# Patient Record
Sex: Female | Born: 1995 | Race: Black or African American | Hispanic: No | Marital: Single | Smoking: Never smoker
Health system: Southern US, Community
[De-identification: ages and names within clinical notes are randomized; demographics above are authoritative.]

## PROBLEM LIST (undated history)

## (undated) DIAGNOSIS — M419 Scoliosis, unspecified: Secondary | ICD-10-CM

## (undated) DIAGNOSIS — R87629 Unspecified abnormal cytological findings in specimens from vagina: Secondary | ICD-10-CM

## (undated) DIAGNOSIS — F32A Depression, unspecified: Secondary | ICD-10-CM

## (undated) DIAGNOSIS — G4700 Insomnia, unspecified: Secondary | ICD-10-CM

## (undated) HISTORY — DX: Scoliosis, unspecified: M41.9

## (undated) HISTORY — DX: Unspecified abnormal cytological findings in specimens from vagina: R87.629

## (undated) HISTORY — DX: Insomnia, unspecified: G47.00

## (undated) HISTORY — PX: COLPOSCOPY: SHX161

## (undated) HISTORY — DX: Depression, unspecified: F32.A

---
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
## 2013-06-25 ENCOUNTER — Ambulatory Visit: Payer: Self-pay | Admitting: Pediatrics

## 2013-07-02 ENCOUNTER — Ambulatory Visit: Payer: Self-pay | Admitting: Pediatrics

## 2013-07-02 VITALS — BP 111/76 | HR 72 | Ht 67.32 in | Wt 126.3 lb

## 2013-07-02 DIAGNOSIS — Z00129 Encounter for routine child health examination without abnormal findings: Secondary | ICD-10-CM

## 2013-07-02 MED ORDER — NORELGESTROMIN-ETH ESTRADIOL 150-35 MCG/24HR TD PTWK *I*
1.0000 | MEDICATED_PATCH | TRANSDERMAL | Status: DC
Start: 2013-07-02 — End: 2013-12-24

## 2013-07-02 NOTE — Patient Instructions (Signed)
May start with Patch this Sunday -- i patch weekly for three weeks and then 1 patch free week  Continue to use condoms until on patch for 1 month    Go to www.healthychildren.org for more information    Your Growing and Changing Body   Brush your teeth twice a day; Visit the dentist twice a year.   Eat 3 healthy meals a day; Eating breakfast is very important.   Consider choosing water instead of soda.   Limit high-fat foods and drinks such as candy, chips, and soft drinks.   5 fruits and vegetables a day; 3 cups of low-fat milk, yogurt, or cheese.   Eat with your family often.   Aim for 1 hour of moderately vigorous physical activity every day.   Try to limit watching TV, playing video games, or playing on the computer to 2 hours a day    Be proud of yourself when you do something good.    Healthy Behavior Choices   Find fun, safe things to do.   Talk to your parents about alcohol and drug use.   Support friends who choose not to use tobacco, alcohol, drugs, steroids, or diet pills.   Talk about relationships, sex, and values with your parent/guardian   Talk about puberty and sexual pressures with someone you trust   Follow your family's rules.    Violence and Injuries   Always wear your seatbelt; Do not ride ATVs.   Wear protective gear including helmets for playing sports, biking, skating, and skateboarding.   Make sure you know how to get help if you are feeling unsafe.   Never have a gun in the home. If necessary, store it unloaded and locked with the ammunition locked separately from the gun.   Figure out nonviolent ways to handle anger or fear. Fighting and carrying weapons can be dangerous. You can talk to me about how to avoid these situations.   Healthy dating relationships are built on respect, concern, and doing things both of you like to do.    How You Are Feeling   Figure out healthy ways to deal with stress; Spend time with your family.   Always talk through problems and never  use violence; Look for ways to help out at home.   It's important for you to have accurate information about sexuality, your physical development, and your sexual feelings.   Please consider asking me if you have   any questions.    School and Friends   Try your best to be responsible for your schoolwork.   If you need help organizing your time, ask your parents or teachers.   Read often.   Find activities you are really interested in, such as sports or theater.   Find activities that help others.   Spend time with your family and help at home.   Stay connected with your parents

## 2013-07-02 NOTE — Progress Notes (Signed)
Adolescent Health Supervision Visit     Subjective   History was provided by (name/relationship): patient.    Sheena Pitts is a 18 y.o. female who was brought in for this adolescent well visit.  Current concerns include: knot in right hip area for about 5 days.    ROS:   Constitutional: negative  Had strep beginning of December -- better with medication  Hurt back beginning of October  - chiropractic helped  Reviewed GAPS form: Yes. Areas of concern: none.    Nutrition/Activity:   Daily fruit/vegetable consumption : 4  Hours in front of screen/day : 2  Daily amount participation in physical activity : > 1  Consumption of sugar added beverages (e.g. Soda, fruit/sports drinks) : occasionally    Screening:  Has a dental home? 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 No  --Able to manage anger/has support system: Yes  --Who do you talk to when you're upset? Mom, friend  (ZH:YQMVHQIONAG:resources, having trusted adult to talk to when upset)    Tobacco:  --Use tobacco? No  --Others smoke in the home?No  GE:XBMWAG:None    ETOH/Drugs:  --Drinks alcohol/how much?: Yes at college - is very careful  --Use marijuana/other drugs?  No    UX:LKGMAG:Peer pressure  Gateway  effects    Sexuality:  Female Reproductive System: Menarche  Age: [age]  Regular menstrual cycle:  Yes  Cramps, PMS:  no  Sexuality: Sexual orientation:  heterosexual  Is sexually active with 1 partner,using condoms  HIV testing offered: testing offered and declined  Need for emergency contraception?: No (if unprotected intercourse in past 5 days)  AG(discussed) :Contraceptive options (start with LARC)  Will use patch    Chaperone offered for PE exam : Refused    Objective   Physical Exam  BP 111/76   Pulse 72   Ht 1.71 m (5' 7.32")   Wt 57.3 kg (126 lb 5.2 oz)   BMI 19.6 kg/m2  Weight %ile: 48%ile (Z=-0.05) based on CDC 2-20 Years weight-for-age data.   Height%tile: 88%ile (Z=1.19) based on CDC 2-20 Years stature-for-age data.  BMI %ile: 23%ile (Z=-0.74) based on CDC 2-20 Years BMI-for-age data.  BP %ile: 46.1% systolic and 83.5% diastolic of BP percentile by age, sex, and height.     GEN:    Alert, well-appearing, in NAD  HEAD:   Normocephalic, atraumatic  EYES:    PERRL, EOMI, conjunctiva clear  EARS:  TMs with normal landmarks, external canal normal  NOSE:   Nares normal  MOUTH: OP clear, MMM, normal dentition  NECK:  FROM, no lymphadenopathy  LUNGS:   CTAB, no wheeze or rales   CV:    RRR, normal S1/S2, no murmur  ABD:    Soft, nondistended,  normal BS, no HSM  MUSC:  FROM all extremities, no joint tenderness  TANNER: Female - breasts 5 and Female - genitalia 5  SPINE:  Mild scoliosis with right rib hump on bending forward  NEURO: Normal tone and strength throughout, gait normal  SKIN:    No rash    Assessment     Healthy 18 y.o. female child, here for [facility].  Active and chronic issues include: mild scoliosis    Plan     1. Current issues: ortho evra patch -- instructions provided on use  Continue with condom use for a month after start patch     2. Anticipatory guidance discussed:drugs, ETOH, and tobacco, importance of regular dental care, importance of exercise and varied diet, sex; STD and pregnancy prevention,  condom use and emotions/coping    3.  Weight management, Self Care and Community Resources   Weight status is healthy weight (5 - <85%tile); Counseled about nutrition and physical activity   Per AAP policy guidelines, prior goal was: 5 fruits and vegetables every day, less than 2 hours of TV/screen time per day, 1 hour exercise per day and 0 sugary drinks.    Patient is currently: Partially successful, will keep working at it   Identify goals for patient to work on (give passport to health): 5 fruits and vegetables every day, less than 2 hours of TV/screen time per day, 1 hour exercise per day and 0 sugary drinks   Motivational Interviewing: contemplative   Self documentation tool given: Handout (food diary, asthma calendar)   Self management tools: www.HealthyChildren.org and www.beahealthyhero.org (5-2-1-0)    4. Immunizations today: Immunizations given:Hepatitis A and Influenza (injectable); I have educated the patient's parent/guardian/designee about each component in all the immunizations and toxoids that are being given today, have reviewed potential side effects and have answered the patients parent/guardian/designee questions about the vaccinations.     5. Labs: Urine for amplification    6. Follow-up visit in 1 year for next annual well visit, or sooner as needed.    7. For confidential results: Provider to call preferred #: [phone]    8. Transitioning to adult care : n/a     Deatra [place], NP 07/02/2013 2:30 PM

## 2013-07-03 LAB — CHLAMYDIA PLASMID DNA AMPLIFICATION: Chlamydia Plasmid DNA Amplification: 0

## 2013-07-03 LAB — N. GONORRHOEAE DNA AMPLIFICATION: N. gonorrhoeae DNA Amplification: 0

## 2013-12-17 ENCOUNTER — Encounter: Payer: Self-pay | Admitting: Pediatric Cardiology

## 2013-12-17 ENCOUNTER — Ambulatory Visit: Payer: Self-pay | Admitting: Pediatric Cardiology

## 2013-12-17 ENCOUNTER — Encounter: Payer: Self-pay | Admitting: Gastroenterology

## 2013-12-17 ENCOUNTER — Ambulatory Visit: Payer: Self-pay | Admitting: Pediatric Genetics

## 2013-12-17 ENCOUNTER — Encounter: Payer: Self-pay | Admitting: Pediatric Genetics

## 2013-12-17 VITALS — BP 106/60 | HR 76 | Resp 20 | Ht 67.32 in | Wt 129.0 lb

## 2013-12-17 DIAGNOSIS — R29898 Other symptoms and signs involving the musculoskeletal system: Secondary | ICD-10-CM

## 2013-12-17 DIAGNOSIS — Z136 Encounter for screening for cardiovascular disorders: Secondary | ICD-10-CM

## 2013-12-17 DIAGNOSIS — M419 Scoliosis, unspecified: Secondary | ICD-10-CM | POA: Insufficient documentation

## 2013-12-17 NOTE — Progress Notes (Signed)
Chief Complaint:    Sheena Pitts is a 18 y.o. female being seen for evaluation of possible Marfan syndrome on 12/17/2013. She was last seen in cardiogenetics clinic in 2013. She was accompanied to today's visit by herself.    HPI:  Sheena Pitts is a 18 y.o. female with right thoracolumbar scoliosis, joint hyperextensibility, and tall stature. She was last seen in cardiogenetics clinic in 2013. At this time, Sheena Pitts did not meet sufficient criteria for Marfan syndrome, but a follow-up appointment was recommended to reassess her.    Scoliosis: moderate  Pectus excavatum: none  Pectus carinatum: none  Joint hypermobility: none  Lens Dislocation: none  Myopia: moderate  Marfanoid habitus: mild   Cardiac: none  Others: joint hyperextensibility    Family history:     Sudden cardiac death: no  Aortic aneurysm: no  MVP: no  Lens dislocation: no  Marfan syndrome: no  Genetic testing: no    Problem List:  Patient Active Problem List   Diagnosis Code    Thoracolumbar Spine Scoliosis     Back pain 724.5    Scoliosis 737.30    Joint hyperextensibility 718.80       Medications:  Current outpatient prescriptions:norelgestromin-ethinyl estradiol (ORTHO EVRA) 150-35 MCG/24HR patch, Place 1 patch onto the skin once a week   Use for 3 weeks, followed by 1 patch-free week., Disp: 3 patch, Rfl: 11,     Allergies:  No Known Allergies (drug, envir, food or latex)    Previous Genetic Test(s):  none    ROS:  General - negative  Head - negative  Neck - negative   Eye - 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 Currently attends [employer]. She wants to be a  Clinical research associatelawyer.    Social History:  Sheena Pitts lives with her mother and father. Mother works in a nursing facility. Father works in [employer] school [employer].    Family History: See scanned pedigree (Media tab) for full details.  Pertinent findings include:    Family History   Problem Relation Age of Onset    Clotting disorder Mother 2841     pulmonary embolism 2x  on coumadin    Hypertension Mother     Hypertension Maternal Uncle     Aneurysm Maternal Grandmother      brain aneurysm; passed away at 1249    Hypertension Maternal Grandmother     Chdhd hrt surg Neg Hx     Heart defect Neg Hx     Marfan syndrome Neg Hx     SIDS Neg Hx     Sudden death Neg Hx     Other Other      niece; died at 66 months of age- Sheena Pitts was not certain of cause of death    Cancer Maternal Uncle [id number]     Colon cancer. Passed away at 48.       Physical Exam:  There were no vitals filed for this visit.    Systemic Score (?7 considered positive):    Wrist AND thumb sign 0   Wrist OR  thumb sign 1   Pectus carinatum deformity 0   Pectus excavatum or chest asymmetry 0   Hindfoot deformity 0   Plain flat foot (pes planus) 0   Pneumothorax 0   Reduced upper segment / lower segment AND increased arm span/height ratios 0  Scoliosis or thoracolumbar kyphosis 1   Reduced elbow extension 0  3 of 5 facial features 0   Skin striae 0  Myopia 1   Mitral valve prolapse 0    Orders placed during this visit:  No orders of the defined types were placed in this encounter.       Impression and Recommendations:  Sheena Pitts is a 18 y.o. female with a history of Marfanoid habitus but who is now no longer showing such strong impressions.  For this reason I do not think that any further follow-up for this reason will be needed.    Counseling/Education/Follow-Up:    Counseling regarding - diagnosis/differential diagnosis and recommended tests    Follow - Up: none.    Sheena Pitts did not receive a clinical diagnosis of Marfan syndrome at today's appointment. Her echocardiogram was  normal. No genetic testing was recommended or performed at today's visit. No follow-up needed.      [facility] you [facility].  Please contact us at [phone] with any questions or concerns.    I spent 45 minutes with Sheena Pitts and her family, >50% of which was spent in counseling.    Felicity Pellegrinihin-To Xian Apostol, MD  Attending, Division of Genetics

## 2013-12-17 NOTE — Progress Notes (Signed)
 I had the opportunity to evaluate Sheena Pitts in follow up in the cardiogenetics clinic at the [facility] at [facility] on 12/17/2013.  As you recall, she is the 18 y.o. 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 She had no cardiac criteria, but Dr. Blake Divine recommended follow up in 2-3 years after she had completed more growth.  She comes to clinic with her self.       Sheena Pitts was originally referred after she was evaluated for her scoliosis by orthopedics who noted arachnodactyly.  Her scoliosis is mild and has not required intervention.  She has no known family history of Marfan's or aortic surgery for valve replacement, aortic dissections.  There is no history of early unexplained sudden cardiac death.  Her father is quite tall at 72'4" but is alive and well at age 75.  The family history on his side of the family is not as well known to [name] and her mother, but mom does not recall anything significant in terms of the heart.  Sheena Pitts's mother has a history of pulmonary embolism twice for which she has had a full hematologic workup, but nothing has come back positive for procoagulant states though she is now on coumadin.  There is a history of sudden death from a brain aneurysm in her maternal grandmother who passed away at age 57. There is no new history of Marfan's or connective tissue disorder. There is no interval history of aortic surgery/dissection.    Sheena Pitts is asymptomatic from a cardiac standpoint.  Sheena Pitts denies cardiovascular symptoms such as chest pain, palpitations, tachycardia, dizziness, syncope, shortness of breath, fatigue or exercise intolerance.  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 She is working at [employer] this summer and living at home.    Sheena Pitts is otherwise healthy and has had no major illnesses, hospitalizations or surgeries. She did go to the  chiropractor for back pain who "rotated her tail bone" which relieved her discomfort. She had a dilated eye exam with a new prescription last year. Her growth and development have been normal.  Immunizations are up to date by family report. Her     Patient's medications, allergies, past medical, surgical, social and family histories were reviewed and updated as appropriate.  She is on no current medications.    Review of Systems:   Eyes:        Wears glasses for nearsightedness.   HENT: Positive for cavities.    Hematological: Positive for anemia. Bruises/bleeds easily.   Musculoskeletal:        Back pain   Neurological: Negative for syncope.   Allergic/Immunologic: Negative for medication allergies.   All other systems reviewed and are negative.      Physical Exam:   Blood pressure 106/60, pulse 76, resp. rate 20, height 1.71 m (5' 7.32"), weight 58.5 kg (128 lb 15.5 oz), SpO2 100 %., Body mass index is 20.01 kg/(m^2).       12/17/2013             BP: 106/60 mmHg    BP Location: Right arm    Patient Position: Lying        Constitutional: She appears healthy. She appears to be in no distress.   Tall and lanky build.   HENT:   Ears: External ears normal.   Nose: Nose normal. No nasal discharge.  Wears glasses. No high arched palate or teeth crowding.   Eyes: Conjunctivae are normal.   Neck: Thyroid normal. Neck supple. No JVD present. No adenopathy.   Cardiovascular: A regular rhythm present. Normal rate.  Quiet precordium.  Normal S1 and physiologically split S2.  No murmur, rub, gallop or click.  Pulses are 2+ and full throughout.   Pulmonary/Chest: Effort normal and breath sounds normal. No stridor. No tachypnea.   No chest deformity.   Abdominal: Soft. Bowel sounds are normal. She exhibits no distension. There is no splenomegaly or hepatomegaly. There is no tenderness.   Musculoskeletal: She exhibits no edema, tenderness or deformity.   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  Mild scoliosus.  No pes  planus.  Arm span 182 cm giving an arm span to height ratio of 1.06.   Neurological: She is alert and oriented to person, place, and time.   Moving all extremities well.  Ambulatory.  Grossly intact.   Skin: Skin is warm and dry. No rash noted. No cyanosis. No jaundice. Nails show no clubbing.       LABORATORIES (I ordered and personally reviewed all lab testing performed):    ECG:  normal EKG, normal sinus rhythm at 76 bpm, there are no previous tracings available for comparison.  All axes, intervals and waveforms are normal.  The corrected QT interval is 416 msec.    ECHOCARDIOGRAM:  Structurally normal heart.  The aortic sinuses measure 3.1 cm which is well within normal limits for her BSA.  All aortic measurements are within normal limits.  There is no mitral valve prolapse.  There is normal biventricular size and function.    DIAGNOSIS:  1. 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  2.       No cardiac criteria for Marfan's disease or connective tissue disorder.  3.       No known family history of Marfan's disease or sudden cardiac death.  4.       Family history of sudden death from a brain aneurysm in maternal grandmother.    IMPRESSION:   I was happy to reassure [name] that she had a normal cardiac evaluation and echocardiogram with no cardiac criteria for a connective tissue disorder.  [name] was also reevaluated by Dr. Blake Divine in pediatric genetics as part of this evaluation and he felt that she still did not meet the modified Ghent criteria for a clinical diagnosis of Marfan's and did not recommend genetic testing or further follow up in genetics. She has completed her growth and her height has not changed in two years.  At the same time, given her musculoskeletal features and the family history of sudden death from a brain aneurysm in her maternal grandmother, I did recommend that she have an adult echocardiogram done in five years to follow  up her aortic growth as an adult.  I explained my findings to [facility] and she appeared to understand.    RECOMMENDATIONS:  1. Continued primary care in your office.  2. There is no indication for activity restriction or SBE prophylaxis.  3. It is not necessary for Meia to have scheduled follow up in pediatric cardiology unless there is a change in her exam or a new concern.  4. I would recommend that Dorlene have an echocardiogram performed by adult cardiology in 5 years just to make sure there is no aortic dilation.    History reviewed. No pertinent past medical history.  History reviewed. No pertinent past surgical history.  Current Outpatient Prescriptions   Medication Sig   . norelgestromin-ethinyl estradiol (ORTHO EVRA) 150-35 MCG/24HR patch Place 1 patch onto the skin once a week   Use for 3 weeks, followed by 1 patch-free week.     No current facility-administered medications for this visit.     No Known Allergies (drug, envir, food or latex)  History     Social History   . Marital Status: Single     Spouse Name: N/A     Number of Children: N/A   . Years of Education: N/A     Occupational History   . Not on file.     Social History Main Topics   . Smoking status: Never Smoker    . Smokeless tobacco: Never Used   . Alcohol Use: Yes      Comment: once-twice per month   . Drug Use: Not on file   . Sexual Activity: Not on file     Other Topics Concern   . Not on file     Social History Narrative    In college, [employer], criminal justice.  Does dance, orientation guide Therapist, sports. Gym, cardio elliptical treadmill. Good student. Lives on campus, home with family in summer.     Family History   Problem Relation Age of Onset   . Clotting disorder Mother 75     pulmonary embolism 2x  on coumadin   . Hypertension Mother    . Hypertension Maternal Uncle    . Aneurysm Maternal Grandmother      brain aneurysm; passed away at 49   . Hypertension Maternal Grandmother    . Chdhd hrt surg Neg Hx    . Heart  defect Neg Hx    . Marfan syndrome Neg Hx    . SIDS Neg Hx    . Sudden death Neg Hx    . Other Other      niece; died at 74 months of age- Bobbyjo was not certain of cause of death   . Cancer Maternal Uncle 48

## 2013-12-17 NOTE — Patient Instructions (Signed)
Patient should get repeat echocardiogram as adult in 5 years

## 2013-12-24 ENCOUNTER — Other Ambulatory Visit: Payer: Self-pay | Admitting: Pediatrics

## 2013-12-31 ENCOUNTER — Ambulatory Visit: Payer: Self-pay | Admitting: Pediatric Cardiology

## 2014-08-05 ENCOUNTER — Other Ambulatory Visit: Payer: Self-pay | Admitting: Pediatrics

## 2014-08-05 MED ORDER — XULANE 150-35 MCG/24HR TD PTWK
1.0000 | MEDICATED_PATCH | TRANSDERMAL | Status: DC
Start: 2014-08-05 — End: 2015-01-14

## 2014-08-05 NOTE — Addendum Note (Signed)
Addended by: Arlyss RepressYAN, Kathryn Cosby on: 08/05/2014 11:21 AM     Modules accepted: Orders

## 2014-10-19 ENCOUNTER — Telehealth: Payer: Self-pay

## 2014-10-19 DIAGNOSIS — Z0000 Encounter for general adult medical examination without abnormal findings: Secondary | ICD-10-CM

## 2014-10-21 NOTE — Telephone Encounter (Signed)
Referral to internal med

## 2014-11-02 ENCOUNTER — Encounter: Payer: Self-pay | Admitting: Internal Medicine

## 2014-11-02 ENCOUNTER — Ambulatory Visit: Payer: Self-pay | Admitting: Internal Medicine

## 2014-11-02 VITALS — BP 104/80 | HR 76 | Temp 97.2°F | Ht 68.5 in | Wt 130.0 lb

## 2014-11-02 DIAGNOSIS — Z0000 Encounter for general adult medical examination without abnormal findings: Secondary | ICD-10-CM

## 2014-11-02 DIAGNOSIS — M25561 Pain in right knee: Secondary | ICD-10-CM

## 2014-11-02 NOTE — Progress Notes (Addendum)
[facility] Progress Note    Reason For Visit:   Chief Complaint   Patient presents with    New Patient Visit       Subjective:     Sheena Pitts is 19 y.o. year old female with hx of thoracolumbar scoliosis, joint hyperextensibility, and tall stature [facility] out of [facility] clinic. [facility] with concern for Marfan's but they did not feel strongly about this diagnosis and her ECHO was normal. They recommended a repeat ECHO in 2020 by adult cardioloy to make sure there is not aortic dilation.     Scoliosis- has been going to chiropractor for her back pain. Has noted this has gotten worse recently with increased dancing and running, but this is usually relieved with adjustments.     Knee Pain- Right knee, she is a Horticulturist, commercialdancer and runner. Notes a lot of knee pain when sitting for extended periods, weight bearing. She notes the pain is worse when she is active. She has been using Ice and knee brace (sleeve brace) Bending and squating makes the pain worse. She has never had images of her knee, no trauma or prior injury.     Bumps- on neck, shoulder, leg and arm. Have been present for the last two weeks. Not itchy or painful.     Past Med Hx: no hx of dm, HTN    Meds: Xulane patch, no other prescriptions, no OTC either.     Family Hx: no DM, father with HTN, mother with PE on coumadin, no stroke, Uncle with colon cancer.     Social: denies smoking, drinks 2 times per month, no illicit drugs. Sexually with males same one for last 3 months. Uses condoms. Her last pap smear was over 1 yr ago. [facility]         Medications:   Medications reviewed and reconciled.     Allergies:   No Known Allergies (drug, envir, food or latex)    Review of Systems:     Pertinent positives and negatives as per HPI.     Physical Exam:     Filed Vitals:    11/02/14 1556   BP: 104/80   Pulse: 76   Temp: 36.2 C (97.2 F)    TempSrc: Temporal   Height: 1.74 m (5' 8.5")   Weight: 58.968 kg (130 lb)     Wt Readings from Last 3 Encounters:   11/02/14 58.968 kg (130 lb)   12/17/13 58.5 kg (128 lb 15.5 oz) (51 %*, Z = 0.03)   07/02/13 57.3 kg (126 lb 5.2 oz) (48 %*, Z = -0.05)     * Growth percentiles are based on CDC 2-20 Years data.     BP Readings from Last 3 Encounters:   11/02/14 104/80   12/17/13 106/60   07/02/13 111/76       General: In no apparent distress. Tall and slender  See VS.    HEENT: Eyes- PERRL. Throat- No exudates.  Neck:  No LAD.  Pulmonary: CTA B/L. No wheezes, rhonchi, or rales.  Cardiovascular: RRR. No murmurs, rubs, or gallops. No pedal edema.  Abdominal: Soft. Non-distended. Non-tender. Bowel sounds present.  EXT: Right knee with FROM, strength 5/5 sensation intact. Anterior posterior drawer normal, Lachman's without laxity. Rotation of the knee do not elicit pain. Mild point tenderness along the medial aspect of joint line on proximal  tibia. No effusion, or erythema noted.   Skin: small well demarcated areas of scaling maculopapular lesions. No exudate or erythema.     Assessment and Plan:     Sheena AlvineSade was seen today for new patient visit.    Diagnoses and associated orders for this visit:    Right medial knee pain  - ice/heat/elevation of the knee when able  - tylenol PRN or ibuprofen PRN for pain  - advised to call office if pain worsens or interferes with her daily activity  - consider MRI of knee to evaluate for meniscal pathology in the future if pain persists or worsens    Rash- scaling maculopapular rash appears like eczema  - will trial some moisturizing lotion and hydrocortisone cream PRN  - advised that she avoid fragrant lotions, body wash, or laundry detergent.     Health care maintenance  - counseled on safe sexual practices, she is low risk for HIV but testing was offered and declined  - general safety reviewed, seatbelts when driving, and helmet when riding a bike.   - safe drinking practices addressed  as well (only drinks 1-2 times per month)    Follow up in 6 months.     Raiford NobleStewart Merla Sawka, MD   Internal Medicine PGY2  [facility] [phone]  11/02/2014 3:50 PM

## 2014-11-02 NOTE — Patient Instructions (Signed)
You can try some hydrocortisone cream on the bumps on your neck, arm and legs. Call if they become painful, or itchy.     Follow up in 6 months for general follow up.

## 2015-01-14 ENCOUNTER — Other Ambulatory Visit: Payer: Self-pay | Admitting: Internal Medicine

## 2015-01-14 ENCOUNTER — Other Ambulatory Visit: Payer: Self-pay | Admitting: Pediatrics

## 2015-01-14 NOTE — Telephone Encounter (Signed)
Patient calling for a refill on medication birth control patches sent to [facility]. Patient can be reached at [phone].   Requested Prescriptions     Pending Prescriptions Disp Refills    XULANE 150-35 MCG/24HR patch 3 patch 2     Sig: Place 1 patch onto the skin once a week   Use for 3 weeks, followed by 1 patch-free week.

## 2015-01-14 NOTE — Telephone Encounter (Signed)
Resident MD unavailable, no [facility] Team grouper available; Routed to today's AM practice grouper for processing 01/14/2015 10:28 AM

## 2015-01-15 MED ORDER — XULANE 150-35 MCG/24HR TD PTWK
1.0000 | MEDICATED_PATCH | TRANSDERMAL | 2 refills | Status: DC
Start: 2015-01-15 — End: 2015-02-15

## 2015-01-15 NOTE — Telephone Encounter (Addendum)
In most recent CCP note it does mention that she was using the patches. Unclear why she is asking for refill ~2 months late. Should discuss at next appt, but I have refilled for total 3 months.     Alcide Clever, MD

## 2015-02-15 ENCOUNTER — Other Ambulatory Visit: Payer: Self-pay | Admitting: Internal Medicine

## 2015-02-15 MED ORDER — XULANE 150-35 MCG/24HR TD PTWK
1.0000 | MEDICATED_PATCH | TRANSDERMAL | 2 refills | Status: DC
Start: 2015-02-15 — End: 2015-06-07

## 2015-02-15 NOTE — Telephone Encounter (Signed)
Medication request routed to Resident MD for processing 02/15/2015 5:53 PM

## 2015-02-15 NOTE — Telephone Encounter (Signed)
Sheena Pitts called to request a prescription for Xulane 150-35 mcg/24 hour patch to be sent to [facility], phone [phone].  [facility] [address] but needs to get it in [place] while she is living there.  Please call her to confirm when it has been approved and sent to pharmacy.

## 2015-06-05 ENCOUNTER — Other Ambulatory Visit: Payer: Self-pay | Admitting: Internal Medicine

## 2015-06-07 NOTE — Telephone Encounter (Signed)
Medication request routed to Dr Raiford NobleStewart Mahler for processing 06/07/2015 11:28 AM

## 2015-06-23 ENCOUNTER — Encounter: Payer: Self-pay | Admitting: Gastroenterology

## 2015-08-02 ENCOUNTER — Other Ambulatory Visit: Payer: Self-pay | Admitting: Internal Medicine

## 2015-08-02 NOTE — Telephone Encounter (Signed)
MD unavailable, Rx request routed to covering Gracey team resident for 08/02/2015

## 2015-08-02 NOTE — Telephone Encounter (Signed)
Usually we leave birth control scripts for gynecology. I am not sure she has a gynecologist yet given it looks like she is just now transitioning out of pediatrics. She is of age to get her first pap smear. I will refill this today as Dr. Al Corpus had in her prior appointment, and forward this message to him.

## 2015-08-16 ENCOUNTER — Ambulatory Visit: Payer: Self-pay | Admitting: Internal Medicine

## 2015-08-16 ENCOUNTER — Encounter: Payer: Self-pay | Admitting: Internal Medicine

## 2015-08-16 VITALS — BP 100/60 | HR 100 | Temp 96.4°F | Ht 68.5 in | Wt 137.2 lb

## 2015-08-16 DIAGNOSIS — G43009 Migraine without aura, not intractable, without status migrainosus: Secondary | ICD-10-CM

## 2015-08-16 DIAGNOSIS — L309 Dermatitis, unspecified: Secondary | ICD-10-CM

## 2015-08-16 DIAGNOSIS — G43909 Migraine, unspecified, not intractable, without status migrainosus: Secondary | ICD-10-CM

## 2015-08-16 DIAGNOSIS — Z0000 Encounter for general adult medical examination without abnormal findings: Secondary | ICD-10-CM

## 2015-08-16 NOTE — Patient Instructions (Addendum)
Headaches- these are likely migraines given the onset and characteristics. You should trial some Excedrin migraine over the counter. Take this medication at the first sign of a migraine starting to help prevent it from reaching full severity.     If the Excedrin fails to help with the migraines please send mychart message and we can trial a medication called Imitrex (sumatriptan) for your headaches. This is a prescription medication that can help prevent the migraine from reaching full severity.     If your migraines persist despite the above treatment we can trial a medication that you take daily to prevent your symptoms.    Have flu vaccine today    Send MyChart message if the headaches persist and are not alleviated by the Excedrin.

## 2015-08-16 NOTE — Progress Notes (Signed)
[facility] Progress Note    Reason For Visit:   Chief Complaint   Patient presents with    Follow-up       Subjective:     Sheena Pitts is 20 y.o. year old female with hx of scoliosis presenting for 6 month follow up.     She will be graduating in May and just finished taking her LAST and currently applying to law school.     Noted intermittent dizziness and some nausea in January around the time she was studying and taking her LSAT Noted some sensation of near syncope but has not passed out. Notes that she will sometimes have to sit down. Almost daily in Jan but since that time no recurrent episodes. Noted some SOB during these episodes, denies palpitations or heart pounding sensation. She has noted frequent migraines starting around this time as well, usually starts on the left side, and then will generalize. Reports photophobia, phonophobia as well. Notes that she will have a migraine 1-2 times per week. Has not tried any medications for the headaches. She would go to sleep to make the pain go away. Noted that last year was the first time she got migraines. Denies flashing lights, nausea, numbness tingling, weakness, confusion, or slurring speech prior to the onset of the headache. Denies focal deficits at this time or during the headache.     Denies fam hx of migraine, seizures, or neurologic disorders.     Denies CP, SOB, cough, fever, chills, n/v, abdominal pain, diarrhea, constipation, dysuria, hematuria.       Medications:   Medications reviewed and reconciled.     Allergies:   No Known Allergies (drug, envir, food or latex)    Review of Systems:     Pertinent positives and negatives as per HPI.     Physical Exam:     Vitals:    08/16/15 1557   BP: 100/60   Pulse: 100   Temp: 35.8 C (96.4 F)   TempSrc: Temporal   Weight: 62.2 kg (137 lb 3.2 oz)   Height: 1.74 m (5' 8.5")     Wt Readings from Last 3 Encounters:   08/16/15 62.2 kg (137 lb 3.2 oz)   11/02/14 59 kg (130 lb)   12/17/13  58.5 kg (128 lb 15.5 oz) (51 %, Z= 0.03)*     * Growth percentiles are based on CDC 2-20 Years data.     BP Readings from Last 3 Encounters:   08/16/15 100/60   11/02/14 104/80   12/17/13 106/60       General: In no apparent distress.  See VS.    HEENT: Eyes- PERRL. Throat- No exudates.  Neck:  No LAD.  Pulmonary: CTA B/L. No wheezes, rhonchi, or rales.  Cardiovascular: No JVD, No carotid bruits. RRR. No murmurs, rubs, or gallops. No pedal edema.  Abdominal: Soft. Non-distended. Non-tender. Bowel sounds present.  Neuro: CN intact bilaterally. Strength and sensation intact grossly, no focal deficits noted on exam.   Skin: notes dry scaling skin on the extensor surfaces of her arms.     Assessment and Plan:     Sheena Pitts was seen today for follow-up.    Diagnoses and all orders for this visit:    Migraine without aura- she reports a hx of migraines but they became more severe in Jan. No preceding aura that she can appreciate. Has not tried abortive therapies at this time. Overall they seem to have improved spontaneously after her rigorous  studying  - wants to try OTC Excedrin Migraine first prior to using Imitrex  - given the lack of aura it is ok for her to continue her Xulane patch  - if her migraines persist she will try Imitrex first and then if they still persist will consider a prophylactic medication in the future.     Health care maintenance  -     Flu vaccine Quadrivalent greater than or equal to 3yo with preservative IM  -     AMB REFERRAL TO OB-GYN for routine gynecologic care.       Eczema, unspecified type  - advised applying lotion to the skin after showering, and without drying the area off to help contain moisture  - if no improvement, may continue using the cortisone cream sparingly for these spots.       Follow up in 6 months, sooner if HA persists.     Raiford Noble, MD   Internal Medicine PGY3  [facility] [phone]  08/16/2015 4:02 PM

## 2015-08-17 ENCOUNTER — Encounter: Payer: Self-pay | Admitting: Internal Medicine

## 2015-08-17 DIAGNOSIS — G43009 Migraine without aura, not intractable, without status migrainosus: Secondary | ICD-10-CM | POA: Insufficient documentation

## 2015-09-22 ENCOUNTER — Other Ambulatory Visit: Payer: Self-pay | Admitting: Internal Medicine

## 2015-09-22 NOTE — Telephone Encounter (Signed)
Medication request routed to Dr Raiford NobleStewart Mahler for processing 09/22/2015 10:07 AM

## 2015-11-22 ENCOUNTER — Encounter: Payer: Self-pay | Admitting: Obstetrics and Gynecology

## 2015-11-22 ENCOUNTER — Ambulatory Visit: Payer: Self-pay | Admitting: Obstetrics and Gynecology

## 2015-11-22 VITALS — BP 116/75 | HR 79 | Ht 68.0 in | Wt 134.9 lb

## 2015-11-22 DIAGNOSIS — Z01419 Encounter for gynecological examination (general) (routine) without abnormal findings: Secondary | ICD-10-CM

## 2015-11-22 DIAGNOSIS — Z124 Encounter for screening for malignant neoplasm of cervix: Secondary | ICD-10-CM

## 2015-11-22 NOTE — Patient Instructions (Signed)
Healthy Practices for Women of all Ages     In addition to your regular OB/GYN history, physical, cholesterol screening, Pap smear, and other recommended cancer screening tests, we ask that you review this list of healthy practices.  Modifying your daily activities according to these practices may improve your overall health and well-being.  In the event of questions about this list, ask your doctor or health care provider.  Additionally, there are specially written pamphlets available, which offer further information.    Diet and Exercise:  - Limit fat and cholesterol; emphasize fruits, grains and vegetables.  - Consume dairy products or use calcium supplementation for adequate calcium intake (1200 mg or more).  - Add folic acid supplementation 0.4 mg (400 micrograms, present in most daily multivitamins) at least two months before considering pregnancy to reduce the risks of birth defects.  - Participate in regular exercise for 30 minutes at least five times a week, and consider weight training.    Injury Prevention:  - Seat/lap belts should be worn while in a moving car.  - Helmet should be used when using motorcycles, bicycles, roller blades and ATVs or skiing.  - Place approved smoke detectors in your house and replace the batteries twice a year.    - Guns and other firearms should be stored unloaded and in a locked area.  Trigger locks should be used as well.  - Consider CPR training for household members.    Dental Health:  - Schedule regular visits to the dentist.  - Floss and brush with fluoride toothpaste daily.    Immunizations:  - A tetanus/diphtheria booster shot (d/T) is recommended every 10 years.  - An MMR vaccine is recommended for non-pregnant women born after 42 without proof of immunity of documentation of previous immunization.  Adults who are susceptible to varicella (chicken pox) should be vaccinated.  - Influenza vaccine is indicated yearly for women age 71 and older, or at any age based  on medical history and exposure.  Pregnant women over [redacted] weeks gestation should receive the flu vaccine as well.  - Pneumococcal pneumonia vaccine is indicated for age 60 and older once in your life.  - Hepatitis A and/or B vaccines are recommended for high-risk individuals.    Substance Abuse:  - Stop smoking; do not use any other tobacco products.  - Avoid alcohol use when driving, boating, swimming or operating other machinery. Avoid excessive use of alcoholic beverages.  - Recreational drug use (marijuana, cocaine, etc.) is dangerous and can be habit-forming.    Sexual Behavior:  - Be sure to use contraception if pregnancy is not desired.  - Regular use of female or female condoms with spermicide helps prevent STDs.  - Consider HIV testing if:  1. You have had more than one sexual partner.  2. You have had any STDs.  3. You have used intravenous drugs.  4. You have a sexual partner with these (the above) risk factors.  5. Your sexual partner has had female homosexual exposure.  6. You received a blood transfusion during 1978-1985.    Breast Health:  - Breast self-examinations should be done monthly (after your period).  - A mammogram should be done once between ages 68-40, then every 1-2 years based on risk factors.  If there is a strong family history of premenopausal breast cancer, you should start with mammograms earlier than age 9.    Colon Cancer Surveillance:  - Beginning at age 6, stool occult blood screening should  be done annually and/or sigmoidoscopy (scope examination of the colon) every 3-5 years.    Women and Alcohol  For women, alcohol use carries some unique and special concerns. Most studies on alcohol have been on men, but we now know women may respond differently to alcohol.   Concerns: Liver damage, cancer, menstrual cycle changes, birth defects, fetal alcohol syndrome, intoxication, nutrition and weight management.  All women should avoid all alcohol during pregnancy.  Help for Problem  Drinking:   One in every three members of Alcoholics Anonymous (AA) is now a woman.  AA meetings are held regularly and have led the way in demonstrating the effectiveness of self-help.   Outpatient and Inpatient Treatment are also available.  Ask your health care provider for a referral.   The first step in successful treatment is to recognize that there is a problem and accept help.    Dietary and Supplemental Calcium  It is important that you build and protect your bone mass through a program of regular exercise and proper nutrition with adequate amounts of calcium in your diet. Dairy products are considered to be the most important sources of calcium.    Reviewed 03/2010

## 2015-11-22 NOTE — Progress Notes (Signed)
Sheena Pitts is a 20 y.o. who presents for annual well woman GYN exam and to establish care. Offers no pelvic, urinary, or vaginal complaints.        GYN History:  Patient's last menstrual period was 11/20/2015 (exact date).   Menarche: 20 yo   Menses:regular periods, lasting 4 days, light to moderate blood flow  Last pap smear: Date: n/a Results: no previous PAP  History of abnormal pap smear: N/A.    The patient is sexually active.   STD History: none  Current contraception: patches, and condoms    OB History   No data available       Past Medical History:   Diagnosis Date    Scoliosis          No past surgical history on file.     Family History   Problem Relation Age of Onset    Aneurysm Maternal Grandmother      brain aneurysm; passed away at 1549    Hypertension Maternal Grandmother     Cancer Maternal Uncle 48     Colon cancer. Passed away at 48.    Cancer Maternal Grandfather     Other Maternal Grandfather      died with meningitis    Clotting disorder Mother [id number]     pulmonary embolism 2x  on coumadin    Hypertension Mother     Hypertension Maternal Uncle     Other Other      niece; died at 736 months of age- [name] was not certain of cause of death    Chdhd hrt surg Neg Hx     Heart defect Neg Hx     Marfan syndrome Neg Hx     SIDS Neg Hx     Sudden death Neg Hx        Current Outpatient Prescriptions   Medication    XULANE 150-35 MCG/24HR patch     No current facility-administered medications for this visit.        No Known Allergies (drug, envir, food or latex)     Social History     Social History    Marital status: Single     Spouse name: N/A    Number of children: N/A    Years of education: N/A     Occupational History    Not on file.     Social History Main Topics    Smoking status: Never Smoker    Smokeless tobacco: Never Used    Alcohol use 1.2 - 1.8 oz/week     2 - 3 Glasses of wine per week      Comment: once-twice per month    Drug use: No    Sexual activity: Yes      Partners: Male     Birth control/ protection: Condom, Patch     Social History Narrative    In college, [employer], criminal justice.  Does dance, orientation guide Therapist, sportssocial service ambassador. Gym, cardio elliptical treadmill. Good student. Lives on campus, home with family in summer.          Screening History:  Immunizations up-to-date:  Yes.    HEALTH HABITS:  (Alcohol history:)    History   Alcohol Use    1.2 - 1.8 oz/week    2 - 3 Glasses of wine per week     Comment: once-twice per month       (Tobacco History:)    History   Smoking Status  Never Smoker   Smokeless Tobacco    Never Used       The patient wears seatbelts:yes  The patient participates in regular exercise: yes  Depression Symptoms: no   Domestic violence:No  Self Breast Awareness:  She does perform BSE.    Balanced Diet:  yes.  She does get adequate calcium in diet.    Multi-vitamin:  yes    ROS:  CONSTITUTIONAL: Appetite good, no fevers, night sweats or weight loss  EYES: No visual changes, no eye pain  ENT: No hearing difficulties, no ear pain  CV: No chest pain, shortness of breath or peripheral edema  RESPIRATORY: No cough, wheezing or dyspnea  GI: No nausea/vomiting, abdominal pain, or change in bowel habits  GU: No dysuria, urgency or incontinence  MS: No joint pain/swelling or musculoskeletal deformities  SKIN: No rashes  NEURO: No MS changes, no motor weakness, no sensory changes  PSYCH: No depression or anxiety  ENDOCRINE: No polyuria/polydipsia, no heat intolerance  HEME/LYMPH: No easy bleeding/bruising or swollen nodes  ALL/IMMUN: No allergic reactions      PHYSICAL EXAM:  Visit Vitals    BP 116/75 (BP Location: Right arm, Patient Position: Sitting, Cuff Size: adult)    Pulse 79    Ht 1.727 m (5\' 8" )    Wt 61.2 kg (134 lb 14.4 oz)    LMP 11/20/2015 (Exact Date)    BMI 20.51 kg/m2      GENERAL: 20 y.o.  Year old  in NAD.  HEENT: Neck supple, thyroid without obvious nodules or enlargement. No lymphadenopathy.   LUNGS:  Respirations unlabored.  HEART: Regular rate without murmurs.  BREASTS: No palpable masses or nipple discharge. No skin changes. No axillary or clavicular adenopathy.  ABDOMEN: Soft, non-tender, no HSM, no palpable masses.    PELVIC EXAM:     Ext Gen: Normal external female genitalia, labia majora, minora, clitoris. BUS WNL.      Vagina: smooth, pink, rugated, no lesions       Cervix: Normal appearing cervix. There is no discharge or CMT.     Uterus is midline, anteverted, mobile, and non-tender.       Adnexa negative for masses; non-tender.  SKIN: No rashes, no evidence of skin breakdown, no suspicious nevi.  MENTAL STATUS: Alert, normal MS. Answers all questions appropriately.        ASSESSMENT/PLAN:  20 y.o. No obstetric history on file. here for routine annual GYN exam    1.  GYN Screening-   Discussed cervical screening guidelines for low risk women.   Thin Prep Pap smear was obtained without High Risk HPV.       --Offered patient comprehensive STD testing.  She agreed to the following tests chlamydia, GC, trichomonas, Denies hepatitis C, hepatitis B, HIV, syphilis      --Mammogram not indicated     2.  Preventative Health-  In regards to health maintenance, Damali was encouraged as below:      --Healthy women's lifestyle handout- given.      --Self breast awareness- reviewed.       --Immunizations- She is up to date.  Annual influenza vaccine recommended during flu season       --Calcium and Vitamin D3- reviewed.   Recommended increasing in diet or adequate supplementation       --Multivitamins- advise one daily.          - Body mass index is 20.51 kg/(m^2).      --Nutrition/weight management- Advised follow a  low fat, low cholesterol diet      --Exercise-Discussed progressive daily aerobic exercise program. Discussed duration, frequency, intensity, and types of exercise    3.  Contraception-        --No contraindications to continue current method.       --Risks and benefits of different contraceptive methods  discussed with patient.       --Risk and prevention of STD/safe sex reviewed.       --Contraceptive plan and follow-up discussed and understood by patient.    4.  Smoking Cessation-        --N/A          5.  Urine dip with positive nitrates. Pt asymptomatic. Will send to aerobic culture to confirm.     Follow up.   Nabila may return to the clinic in 1 year for annual gyn exam or as needed for new issues and concerns.      Joice Lofts, NP

## 2015-11-24 ENCOUNTER — Telehealth: Payer: Self-pay | Admitting: Obstetrics and Gynecology

## 2015-11-24 LAB — TRICHOMONAS DNA AMPLIFICATION: Trichomonas DNA amplification: 0

## 2015-11-24 LAB — CHLAMYDIA PLASMID DNA AMPLIFICATION: Chlamydia Plasmid DNA Amplification: 0

## 2015-11-24 LAB — AEROBIC CULTURE

## 2015-11-24 LAB — N. GONORRHOEAE DNA AMPLIFICATION: N. gonorrhoeae DNA Amplification: 0

## 2015-11-24 MED ORDER — NITROFURANTOIN MONOHYD MACRO 100 MG PO CAPS *I*
100.0000 mg | ORAL_CAPSULE | Freq: Two times a day (BID) | ORAL | 0 refills | Status: DC
Start: 2015-11-24 — End: 2015-12-06

## 2015-11-24 NOTE — Telephone Encounter (Signed)
Pt aware of urine culture results indicating she has an UTI. Reviewed below recommendations. Pt verbalizes understanding and will call back office with any further questions.

## 2015-11-24 NOTE — Telephone Encounter (Signed)
Patient is returning call.  °

## 2015-11-24 NOTE — Telephone Encounter (Signed)
Please notify Patient she was  positive for UTI from urinalysis.     I prescribed Macrobid 100 mg BID x 7 days. Please take full course of antibiotic even if symptoms have improved. Increase PO hydration. Review preventive strategies including wiping front to back, avoid bubble baths, and thong underwear, voiding after sex. IF pt develops any fevers, chills, or back pain please f/u for re-evaluation.

## 2015-11-24 NOTE — Telephone Encounter (Signed)
Left message for patient to return call to [facility] at [phone].

## 2015-11-26 ENCOUNTER — Encounter: Payer: Self-pay | Admitting: Internal Medicine

## 2015-11-26 LAB — HPV DNA PROBE WITH CYTOLOGY
HPV Other High Risk: POSITIVE
HPV Type 16: NEGATIVE
HPV Type 18: NEGATIVE

## 2015-11-30 LAB — GYN CYTOLOGY

## 2015-12-01 ENCOUNTER — Telehealth: Payer: Self-pay | Admitting: Obstetrics and Gynecology

## 2015-12-01 DIAGNOSIS — R87612 Low grade squamous intraepithelial lesion on cytologic smear of cervix (LGSIL): Secondary | ICD-10-CM

## 2015-12-01 DIAGNOSIS — R87619 Unspecified abnormal cytological findings in specimens from cervix uteri: Secondary | ICD-10-CM | POA: Insufficient documentation

## 2015-12-01 NOTE — Telephone Encounter (Signed)
Patient returned the call.  Phone number verified.

## 2015-12-01 NOTE — Telephone Encounter (Signed)
Called pt to discuss abnormal PAP smear results. Left message asking her to return call. 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 This means that there have been mild changes to her cervix and that she is positive for the virus HPV. This virus is linked to an increase risk for cervical cancer. No further work up necessary at this time. According to guidelines next step is repeat cervical PAP in one year. She should scheduled a repeat PAP for June 2018.

## 2015-12-01 NOTE — Telephone Encounter (Signed)
Call returned to pt,reviewed C.Scheiber's message in full with the pt.  Pt voiced a good understanding,agreeable to return for Pap in June,2018.  Compliance reinforced,pt voiced understanding.

## 2015-12-01 NOTE — Telephone Encounter (Signed)
Will await call back from pt.

## 2015-12-02 ENCOUNTER — Other Ambulatory Visit: Payer: Self-pay | Admitting: Internal Medicine

## 2015-12-02 DIAGNOSIS — Z0000 Encounter for general adult medical examination without abnormal findings: Secondary | ICD-10-CM

## 2015-12-02 NOTE — Progress Notes (Addendum)
Immunizations:  Medicare?  no      Tdap/Td:                               Medicare to local pharmacy                               If > 22 yo   Overdue: Yes        Agrees to schedule:  yes    *If none Tdap x 1       Order code: [id number]   If Tdap, then Td every 10 years     Order code: [id number]        Information:  For Tdap and Zostavax with Medicare:  Medicare and/or dual eligible patients should be directed to their pharmacy to receive these vaccines (a copay may apply - though usually minimal).   • Local pharmacies that have all vaccinations available include:   o [facility], [facility], [facility], [facility], [facility] (dependent upon location)

## 2015-12-03 ENCOUNTER — Telehealth: Payer: Self-pay | Admitting: Internal Medicine

## 2015-12-03 NOTE — Telephone Encounter (Signed)
Please review 06/15 patient outreach encounter     Patient is returning call to office     Patient states no message was left but had a couple missed call from our office     Patient can be reached at [phone] to advise   Patient states the best time to contact is after 1 pm

## 2015-12-03 NOTE — Addendum Note (Signed)
Addended by: Leola Brazil on: 12/03/2015 03:12 PM     Modules accepted: Orders

## 2015-12-06 ENCOUNTER — Ambulatory Visit: Payer: Self-pay | Admitting: Internal Medicine

## 2015-12-06 ENCOUNTER — Encounter: Payer: Self-pay | Admitting: Internal Medicine

## 2015-12-06 VITALS — BP 112/66 | HR 80 | Temp 97.7°F | Wt 134.8 lb

## 2015-12-06 DIAGNOSIS — L309 Dermatitis, unspecified: Secondary | ICD-10-CM

## 2015-12-06 MED ORDER — TRIAMCINOLONE ACETONIDE 0.1 % EX CREA *I*
TOPICAL_CREAM | Freq: Two times a day (BID) | CUTANEOUS | 2 refills | Status: AC
Start: 2015-12-06 — End: ?

## 2015-12-06 NOTE — Progress Notes (Signed)
[facility] Progress Note    Reason For Visit:   Chief Complaint   Patient presents with    Follow-up       Subjective:     Sheena Pitts is 20 y.o. year old female with PMH scoliosis, migraine without aura presenting for follow up.     She was seen by ObGyn on 11/22/15. She was treated for positive urine cx with macrobid, but she was asymptomatic at the time. 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     She is doing well overall. Passed her L-SAT and she got into [employer] [place] She starts in August. She will be returning to [place] throughout the year because her family is here.     Eczema- continues to have pruritis lesions on the extensor surfaces of her arms b/l and her thighs. Denies oral ulcers, lesions on palms or soles. This has not improved with OTC hydrocortisone cream. She continues to use detergants with fragrance and dyes, and switches frequently.     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 LGSIL on pap smear and she will have yearly follow up.     Denies HA, lightheadedness, dizziness, CP, SOB, cough, fever, chills, n/v, abdominal pain, diarrhea, constipation, dysuria, hematuria.      Medications:   Medications reviewed and reconciled.     Allergies:   No Known Allergies (drug, envir, food or latex)    Review of Systems:     Pertinent positives and negatives as per HPI.     Physical Exam:     Vitals:    12/06/15 1304   BP: 112/66   BP Location: Right arm   Patient Position: Sitting   Cuff Size: adult   Pulse: 80   Temp: 36.5 C (97.7 F)   TempSrc: Temporal   Weight: 61.1 kg (134 lb 12.8 oz)     Wt Readings from Last 3 Encounters:   12/06/15 61.1 kg (134 lb 12.8 oz)   11/22/15 61.2 kg (134 lb 14.4 oz)   08/16/15 62.2 kg (137 lb 3.2 oz)     BP  Readings from Last 3 Encounters:   12/06/15 112/66   11/22/15 116/75   08/16/15 100/60       General: In no apparent distress.  See VS.    HEENT: Eyes- PERRL. Throat- No exudates.  Neck:  No LAD.  Pulmonary: CTA B/L. No wheezes, rhonchi, or rales.  Cardiovascular: No JVD, No carotid bruits. RRR. No murmurs, rubs, or gallops. No pedal edema.  Abdominal: Soft. Non-distended. Non-tender. Bowel sounds present.  Skin: Raised patches of smooth papules, no erythema, scaling or exudate noted. Located sporadically on extensor surfaces, elbows, proximal UE, and thighs b/l.     Assessment and Plan:     [name] was seen today for follow-up.    Diagnoses and all orders for this visit:    Eczema, unspecified type  -     triamcinolone (KENALOG) 0.1 % cream; Apply topically 2 times daily  - advised to avoid dye and fragrance detergents   - apply moisturize right after showering without drying off to retain moisture.     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 Repeat pap smear     Raiford Noble, MD   Internal Medicine PGY3  [facility] [phone]  12/06/2015 1:04 PM

## 2015-12-06 NOTE — Addendum Note (Signed)
Addended by: Leola Brazil on: 12/06/2015 01:11 PM     Modules accepted: Orders

## 2015-12-06 NOTE — Patient Instructions (Addendum)
CONGRATS ON GETTING INTO [employer]!!!!      Patient Education      Eczema (Atopic Dermatitis)    The Basics   Written by the doctors and editors at UpToDate   What is eczema?--Eczema is a skin condition that makes your skin itchy and flaky. Doctors do not know what causes it. Eczema often happens in people who have allergies. It can also run in families. Another term for eczema is atopic dermatitis.  What are the symptoms of eczema?--The symptoms of eczema can include:  ?Intense itching  ?Redness  ?Small bumps  ?Skin that flakes off or forms scales  Most people with eczema have their first symptoms before they turn 5. But eczema can look different in people of different ages:  ?In babies, eczema tends to affect the front of the arms and legs, cheeks, or scalp. (The diaper area is not usually affected.)  ?In older children and adults, eczema often affects the sides of the neck, the elbow creases, and the backs of the knees. Adults can also get it on their face, wrists, hands, and forearms.  ?In older children and adults, the skin can become thick and dark. It might even forms scars from too much scratching.  Is there a test for eczema?--No, there is no test. But doctors and nurses can tell if you have eczema it by looking at your skin and by asking you questions.   What can I do to reduce my symptoms?--Use unscented thick moisturizing creams and ointments to keep the skin from getting too dry.Also, try to avoid things that can make eczema worse, such as:  ?Having dry skin that has not been treated with moisturizing creams or ointments   ?Being too hot or sweating too much  ?Being in very dry air  ?Stress or worry  ?Sudden temperature changes  ?Harsh soaps or cleaning products  ?Perfumes   ?Wool or synthetic fabrics (like polyester)   How is eczema treated?--There are treatments that can relieve the symptoms of eczema. But the condition cannot be cured. Even so, about half of children with eczema grow out  of it by the time they become adults. The treatments for eczema include:  ?Moisturizing creams or ointments - These products help keep your skin moist. In some cases, your doctor or nurse might suggest using a moist dressing over special creams or medicines.  ?Steroid creams and ointments - These medicines are different than the steroids athletes take to build muscle. They go on the skin, and they relieve itching and redness. (In severe cases, you might need steroids in pills. But your doctor or nurse will want to take you off steroid pills as soon as possible. Even though these medicines help, they can also cause problems of their own.)  ?Medicines that change the way the immune system works - These medicines are only for people who do not get better with safer treatment options.   ?Antihistamine pills - Antihistamines are the medicines people often take for allergies. Some people with eczema find that antihistamines relieve itching. Others do not think the medicines do any good. Many people with eczema find that itching is worst at night. That can make it hard to sleep. If you have this problem, talk with your doctor or nurse about it. He or she might recommend an antihistamine that can also help with sleep.  ?Light therapy - Another treatment option is something called light therapy, but doctors do not use it much. During light therapy,  your skin is exposed to a special kind of light called ultraviolet light. This therapy is usually done in a doctors office.   Light therapy can help with eczema but experts worry that it might increase a person's risk for skin cancer. Doctors usually recommend it for people who do not get better with other treatments.  Can eczema be prevented?--Maybe. Babies who have a parent, brother, or sister with eczema have a higher risk of getting it, too. In these babies, using moisturizing creams or ointments (starting right after birth) might help prevent eczema during the first  year. But doctors dont yet know if this also helps prevent eczema later on.  All topics are updated as new evidence becomes available and our peer review process is complete.  This topic retrieved from UpToDate on: Oct 08, 2013.  Topic 15392 Version 6.0  Release: 23.3 - C23.74  2015[employer]ToDate, Inc.All rights reserved.  Consumer Information Use and Disclaimer   This information is not specific medical advice and does not replace information you receive from your health care provider. This is only a brief summary of general information. It does NOT include all information about conditions, illnesses, injuries, tests, procedures, treatments, therapies, discharge instructions or life-style choices that may apply to you. You must talk with your health care provider for complete information about your health and treatment options. This information should not be used to decide whether or not to accept your health care provider's advice, instructions or recommendations. Only your health care provider has the knowledge and training to provide advice that is right for you.The use of UpToDate content is governed by the UpToDate Terms of Use. 2015 [employer], Inc. All rights reserved.  Copyright   2015[employer], Inc.All rights reserved.            Triamcinolone (Topical) (trye am SIN oh lone)    Brand Names: [place] Dermasorb TA; Kenalog; Oralone; Pediaderm TA; Trianex; Triderm   Brand Names: [place] Kenalog; Oracort; Triaderm   What is this drug used for?    All skin products:   It is used to treat skin irritation.   It is used to treat skin rashes.   Oral paste:   It is used to treat mouth irritation.  What do I need to tell my doctor BEFORE I take this drug?    All products:   If you have an allergy to triamcinolone or any other part of this drug.   If you are allergic to any drugs like this one, any other drugs, foods, or other substances. Tell your doctor about the allergy and what signs you had, like rash; hives;  itching; shortness of breath; wheezing; cough; swelling of face, lips, tongue, or throat; or any other signs.   Oral paste:   If you have a mouth or throat infection.   This is not a list of all drugs or health problems that interact with this drug.   Tell your doctor and pharmacist about all of your drugs (prescription or OTC, natural products, vitamins) and health problems. You must check to make sure that it is safe for you to take this drug with all of your drugs and health problems. Do not start, stop, or change the dose of any drug without checking with your doctor.  What are some things I need to know or do while I take this drug?    All products:   Tell dentists, surgeons, and other doctors that you use this drug.   Do not  use longer than you have been told by the doctor.   Talk with your doctor before you use other drugs or products on your skin.   Use care when putting on a large part of the skin or where there are open wounds. Talk with the doctor.   If you use this drug too often, your skin problem may become worse.   Use with care in children. Talk with the doctor.   This drug may affect growth in children and teens in some cases. They may need regular growth checks. Talk with the doctor.   Tell your doctor if you are pregnant or plan on getting pregnant. You will need to talk about the benefits and risks of using this drug while you are pregnant.   Tell your doctor if you are breast-feeding. You will need to talk about any risks to your baby.   Spray:   This drug may catch on fire. Do not use near an open flame or while smoking.   Do not breathe in the vapors.  What are some side effects that I need to call my doctor about right away?    WARNING/CAUTION: Even though it may be rare, some people may have very bad and sometimes deadly side effects when taking a drug. Tell your doctor or get medical help right away if you have any of the following signs or symptoms that may be related to  a very bad side effect:   All products:   Signs of an allergic reaction, like rash; hives; itching; red, swollen, blistered, or peeling skin with or without fever; wheezing; tightness in the chest or throat; trouble breathing or talking; unusual hoarseness; or swelling of the mouth, face, lips, tongue, or throat.   Signs of high blood sugar like confusion, feeling sleepy, more thirst, more hungry, passing urine more often, flushing, fast breathing, or breath that smells like fruit.   Oral paste:   Very bad mouth irritation.   All skin products:   Skin changes (pimples, stretch marks, slow healing, hair growth).   Skin irritation.   Round face.   Muscle weakness.   Very bad headache.   Fever.   Change in eyesight.   Itching.   Burning.   Dry skin.   Swelling.   Change in color of skin.   Thinning of the skin.  What are some other side effects of this drug?    All drugs may cause side effects. However, many people have no side effects or only have minor side effects. Call your doctor or get medical help if any of these side effects or any other side effects bother you or do not go away:   Stinging.   These are not all of the side effects that may occur. If you have questions about side effects, call your doctor. Call your doctor for medical advice about side effects.   You may report side effects to your national health agency.  How is this drug best taken?    Use this drug as ordered by your doctor. Read all information given to you. Follow all instructions closely.   All products:   Use as you have been told, even if your signs get better.   All skin products:   Do not take this drug by mouth. Use on your skin only. Keep out of your mouth, nose, and eyes (may burn).   Wash your hands before and after use. Do not wash your hands after use if  putting this on your hand.   Clean affected part before use. Make sure to dry well.   Put a thin layer on the affected skin and rub in  gently.   Do not put on the face, underarms, or the groin area unless told to do so by the doctor.   Do not use coverings (bandages, dressings) unless told to do so by the doctor.   Do not use tight-fitting diapers or plastic pants if treated part is in the diaper area. This may cause more drug to get into the body.   Do not use to treat diaper rash.   Do not put on cuts, scrapes, eczema, or damaged skin.   Oral paste:   Wash your hands before and after use.   Put a thin layer on the affected part with a cotton swab. Do not rub in.   Do not swallow this drug.  What do I do if I miss a dose?    Put on a missed dose as soon as you think about it.   If it is close to the time for your next dose, skip the missed dose and go back to your normal time.   Do not put on 2 doses or extra doses.  How do I store and/or throw out this drug?    All products:   Store at room temperature. Do not freeze.   Keep all drugs out of the reach of children and pets.   Check with your pharmacist about how to throw out unused drugs.   Spray:   Protect from heat or open flame.   Do not puncture.  General drug facts    If your symptoms or health problems do not get better or if they become worse, call your doctor.   Do not share your drugs with others and do not take anyone else's drugs.   Keep a list of all your drugs (prescription, natural products, vitamins, OTC) with you. Give this list to your doctor.   Talk with the doctor before starting any new drug, including prescription or OTC, natural products, or vitamins.   Some drugs may have another patient information leaflet. If you have any questions about this drug, please talk with your doctor, pharmacist, or other health care provider.   If you think there has been an overdose, call your poison control center or get medical care right away. Be ready to tell or show what was taken, how much, and when it happened.  Consumer Information Use and Disclaimer    This  information should not be used to decide whether or not to take this medicine or any other medicine. Only the healthcare provider has the knowledge and training to decide which medicines are right for a specific patient. This information does not endorse any medicine as safe, effective, or approved for treating any patient or health condition. This is only a brief summary of general information about this medicine. It does NOT include all information about the possible uses, directions, warnings, precautions, interactions, adverse effects, or risks that may apply to this medicine. This information is not specific medical advice and does not replace information you receive from the healthcare provider. You must talk with the healthcare provider for complete information about the risks and benefits of using this medicine.  Last Reviewed Date   2013-08-27  Copyright     2015 [facility] [employer] and its affiliates and/or licensors. All rights reserved.

## 2016-01-03 ENCOUNTER — Telehealth: Payer: Self-pay | Admitting: Internal Medicine

## 2016-01-03 NOTE — Telephone Encounter (Signed)
Received paperwork for Immunization and Medical history form.   Placed in team groupers folder for completion on 01/03/2016.

## 2016-01-07 NOTE — Telephone Encounter (Signed)
Immunization and medical history form faxed to [employer] on 01/07/2016

## 2016-01-10 ENCOUNTER — Other Ambulatory Visit: Payer: Self-pay | Admitting: Internal Medicine

## 2016-01-10 NOTE — Telephone Encounter (Signed)
Rx request sent to [facility] on 01/10/2016 11:06 AM. Provider unavailable.

## 2016-03-13 ENCOUNTER — Other Ambulatory Visit: Payer: Self-pay | Admitting: Nephrology

## 2016-03-13 NOTE — Telephone Encounter (Signed)
Rx request sent to provider for processing on 03/13/2016 3:42 PM

## 2016-04-07 ENCOUNTER — Other Ambulatory Visit: Payer: Self-pay | Admitting: Internal Medicine

## 2016-04-07 MED ORDER — NORELGESTROMIN-ETH ESTRADIOL 150-35 MCG/24HR TD PTWK *I*
1.0000 | MEDICATED_PATCH | TRANSDERMAL | 0 refills | Status: DC
Start: 2016-04-07 — End: 2016-05-09

## 2016-04-07 NOTE — Telephone Encounter (Signed)
Per insurance, recommending 90-day supply to reduce patient's copayment expense.    Rx request routed to provider on 04/07/2016 at 12:53 PM

## 2016-04-07 NOTE — Telephone Encounter (Signed)
Zeppelin Nefertiti Braxton calling to request prescription(s)   XULANE 150-35 MCG/24HR patch 90 day supply to be sent to [id number] - [place], [place] - [address]    Is patient out of the medication? no  Does the patient have questions regarding the medication for the nurse? no    Patient can be reached if necessary at [phone].

## 2016-05-09 ENCOUNTER — Other Ambulatory Visit: Payer: Self-pay | Admitting: Internal Medicine

## 2016-05-09 MED ORDER — NORELGESTROMIN-ETH ESTRADIOL 150-35 MCG/24HR TD PTWK *I*
1.0000 | MEDICATED_PATCH | TRANSDERMAL | 0 refills | Status: DC
Start: 2016-05-09 — End: 2016-06-09

## 2016-05-09 NOTE — Telephone Encounter (Signed)
Rx request sent to [facility] on 05/09/2016 2:43 PM. Provider unavailable.

## 2016-05-09 NOTE — Telephone Encounter (Signed)
Rx request sent to [facility] on 05/09/2016 4:30 PM. Provider unavailable.

## 2016-06-09 ENCOUNTER — Other Ambulatory Visit: Payer: Self-pay | Admitting: Internal Medicine

## 2016-06-09 NOTE — Telephone Encounter (Signed)
Rx request sent to [facility] on 06/09/2016 12:38 PM. Provider unavailable.

## 2016-06-30 ENCOUNTER — Other Ambulatory Visit: Payer: Self-pay | Admitting: Family Medicine

## 2016-06-30 ENCOUNTER — Other Ambulatory Visit (HOSPITAL_COMMUNITY)
Admission: RE | Admit: 2016-06-30 | Discharge: 2016-06-30 | Disposition: A | Discharge status: Home / Self Care | Payer: BLUE CROSS/BLUE SHIELD | Source: Ambulatory Visit | Attending: Family Medicine | Admitting: Family Medicine

## 2016-06-30 DIAGNOSIS — Z01411 Encounter for gynecological examination (general) (routine) with abnormal findings: Secondary | ICD-10-CM | POA: Diagnosis present

## 2016-06-30 DIAGNOSIS — R87810 Cervical high risk human papillomavirus (HPV) DNA test positive: Secondary | ICD-10-CM | POA: Insufficient documentation

## 2016-06-30 DIAGNOSIS — Z1151 Encounter for screening for human papillomavirus (HPV): Secondary | ICD-10-CM | POA: Diagnosis present

## 2016-07-05 LAB — CYTOLOGY - PAP
Adequacy: ABSENT — AB
DIAGNOSIS: UNDETERMINED — AB
HPV: DETECTED — AB

## 2016-10-19 ENCOUNTER — Emergency Department (HOSPITAL_COMMUNITY)
Admission: EM | Admit: 2016-10-19 | Discharge: 2016-10-20 | Disposition: A | Discharge status: Home / Self Care | Payer: BLUE CROSS/BLUE SHIELD | Attending: Emergency Medicine | Admitting: Emergency Medicine

## 2016-10-19 ENCOUNTER — Encounter (HOSPITAL_COMMUNITY): Payer: Self-pay | Admitting: Emergency Medicine

## 2016-10-19 DIAGNOSIS — N764 Abscess of vulva: Secondary | ICD-10-CM | POA: Insufficient documentation

## 2016-10-19 DIAGNOSIS — L0291 Cutaneous abscess, unspecified: Secondary | ICD-10-CM

## 2016-10-19 DIAGNOSIS — Z79899 Other long term (current) drug therapy: Secondary | ICD-10-CM | POA: Insufficient documentation

## 2016-10-19 MED ORDER — LIDOCAINE HCL 2 % IJ SOLN
10.0000 mL | Freq: Once | INTRAMUSCULAR | Status: AC
  Administered 2016-10-19: 200 mg
  Filled 2016-10-19: qty 20

## 2016-10-19 MED ORDER — CLINDAMYCIN HCL 150 MG PO CAPS: 150.0000 mg | ORAL_CAPSULE | Freq: Four times a day (QID) | ORAL | 0 refills | Status: DC

## 2016-10-19 MED ORDER — OXYCODONE-ACETAMINOPHEN 5-325 MG PO TABS
1.0000 | ORAL_TABLET | Freq: Once | ORAL | Status: AC
  Administered 2016-10-19: 1 via ORAL
  Filled 2016-10-19: qty 1

## 2016-10-19 MED ORDER — HYDROCODONE-ACETAMINOPHEN 5-325 MG PO TABS: 2.0000 | ORAL_TABLET | ORAL | 0 refills | Status: DC | PRN

## 2016-10-19 NOTE — ED Provider Notes (Signed)
[facility]-EMERGENCY DEPT Provider Note   CSN: [id number] Arrival date & time: 10/19/16  1433     History   Chief Complaint Chief Complaint  Patient presents with  . Abscess    HPI Traci Campbell is a 21 y.o. female.  She presents for evaluation of sore on right labia, for about 10 days, unimproved with oral Septra, for 1 week.  No prior similar problem.  She denies fever, chills, nausea, vomiting, weakness or dizziness.  There are no other known modifying factors.   HPI  History reviewed. No pertinent past medical history.  There are no active problems to display for this patient.   History reviewed. No pertinent surgical history.  OB History    No data available       Home Medications    Prior to Admission medications   Medication Sig Start Date End Date Taking? Authorizing Provider  norelgestromin-ethinyl estradiol (ORTHO EVRA) 150-35 MCG/24HR transdermal patch Place 1 patch onto the skin once a week.   Yes Historical Provider, MD  sulfamethoxazole-trimethoprim (BACTRIM DS,SEPTRA DS) 800-160 MG tablet Take 1 tablet by mouth 2 (two) times daily.   Yes Historical Provider, MD  clindamycin (CLEOCIN) 150 MG capsule Take 1 capsule (150 mg total) by mouth every 6 (six) hours. 10/19/16   Mancel BaleElliott Audwin Semper, MD  HYDROcodone-acetaminophen (NORCO) 5-325 MG tablet Take 2 tablets by mouth every 4 (four) hours as needed. 10/19/16   Mancel BaleElliott Dionisios Ricci, MD    Family History No family history on file.  Social History Social History  Substance Use Topics  . Smoking status: Never Smoker  . Smokeless tobacco: Never Used  . Alcohol use Yes     Allergies   Patient has no known allergies.   Review of Systems Review of Systems  All other systems reviewed and are negative.    Physical Exam Updated Vital Signs BP 111/78 (BP Location: Left Arm)   Pulse 93   Temp 98.7 F (37.1 C) (Oral)   Resp (!) 21   Ht 5\' 8"  (1.727 m)   Wt 136 lb (61.7 kg)   LMP 09/28/2016   SpO2 99%   BMI 20.68  kg/m   Physical Exam  Constitutional: She is oriented to person, place, and time. She appears well-developed and well-nourished.  HENT:  Head: Normocephalic and atraumatic.  Eyes: Conjunctivae and EOM are normal. Pupils are equal, round, and reactive to light.  Neck: Normal range of motion and phonation normal. Neck supple.  Cardiovascular: Normal rate and regular rhythm.   Pulmonary/Chest: Effort normal.  Genitourinary:  Genitourinary Comments: Diffuse swelling right labia, moderate, with superficial blister, containing purulent material.  Moderate tenderness to palpation.  No significant coronary adenopathy.  Musculoskeletal: Normal range of motion.  Neurological: She is alert and oriented to person, place, and time. She exhibits normal muscle tone.  Skin: Skin is warm and dry.  Psychiatric: She has a normal mood and affect. Her behavior is normal. Judgment and thought content normal.  Nursing note and vitals reviewed.    ED Treatments / Results  Labs (all labs ordered are listed, but only abnormal results are displayed) Labs Reviewed - No data to display  EKG  EKG Interpretation None       Radiology No results found.  Procedures .[name].Incision and Drainage Date/Time: 10/19/2016 7:40 PM Performed by: Mancel BaleWENTZ, Thaddeus Evitts Authorized by: Mancel BaleWENTZ, Haaris Metallo   Consent:    Consent obtained:  Verbal   Risks discussed:  Bleeding, incomplete drainage and pain   Alternatives discussed:  No  treatment Location:    Type:  Abscess   Size:  Right labia Pre-procedure details:    Skin preparation:  Betadine Anesthesia (see MAR for exact dosages):    Anesthesia method:  Local infiltration   Local anesthetic:  Lidocaine 2% w/o epi Procedure type:    Complexity:  Simple Procedure details:    Needle aspiration: no     Incision types:  Single straight   Scalpel blade:  11   Wound management:  Probed and deloculated   Drainage:  Purulent   Drainage amount:  Moderate   Wound treatment:   Wound left open   Packing materials:  None    (including critical care time)  Medications Ordered in ED Medications  oxyCODONE-acetaminophen (PERCOCET/ROXICET) 5-325 MG per tablet 1 tablet (1 tablet Oral Given 10/19/16 1657)  lidocaine (XYLOCAINE) 2 % (with pres) injection 200 mg (200 mg Other Given 10/19/16 1657)     Initial Impression / Assessment and Plan / ED Course  I have reviewed the triage vital signs and the nursing notes.  Pertinent labs & imaging results that were available during my care of the patient were reviewed by me and considered in my medical decision making (see chart for details).     Medications  oxyCODONE-acetaminophen (PERCOCET/ROXICET) 5-325 MG per tablet 1 tablet (1 tablet Oral Given 10/19/16 1657)  lidocaine (XYLOCAINE) 2 % (with pres) injection 200 mg (200 mg Other Given 10/19/16 1657)    Patient Vitals for the past 24 hrs:  BP Temp Temp src Pulse Resp SpO2 Height Weight  10/19/16 1958 111/78 98.7 F (37.1 C) Oral 93 (!) 21 99 % - -  10/19/16 1910 116/72 - - 88 18 99 % - -  10/19/16 1449 - - - - - - 5\' 8"  (1.727 m) 136 lb (61.7 kg)  10/19/16 1446 121/75 98.3 F (36.8 C) Oral 73 18 94 % - -    At discharge- reevaluation with update and discussion. After initial assessment and treatment, an updated evaluation reveals she is more comfortable has no further complaints.Mancel Bale L    Final Clinical Impressions(s) / ED Diagnoses   Final diagnoses:  Abscess   Labial abscess, cause unclear.  Doubt sepsis, cellulitis or metabolic instability.  Nursing Notes Reviewed/ Care Coordinated Applicable Imaging Reviewed Interpretation of Laboratory Data incorporated into ED treatment  The patient appears reasonably screened and/or stabilized for discharge and I doubt any other medical condition or other [facility] requiring further screening, evaluation, or treatment in the ED at this time prior to discharge.  Plan: Home Medications-OTC as needed; Home  Treatments-warm soaks 3 times daily; return here if the recommended treatment, does not improve the symptoms; Recommended follow up-return here if needed for problems.    New Prescriptions New Prescriptions   CLINDAMYCIN (CLEOCIN) 150 MG CAPSULE    Take 1 capsule (150 mg total) by mouth every 6 (six) hours.   HYDROCODONE-ACETAMINOPHEN (NORCO) 5-325 MG TABLET    Take 2 tablets by mouth every 4 (four) hours as needed.     Mancel Bale, MD 10/19/16 [id number]

## 2016-10-19 NOTE — Discharge Instructions (Signed)
Soak in a warm tub for 30 minutes 3 or 4 times a day.  Clean the sore area with soap and water after each soaking.  Return here or see your doctor as needed for problems.

## 2016-10-19 NOTE — ED Triage Notes (Signed)
Patient is complaining of peritoneal abscess x1 week. Patient seen at urgent care and started on antibiotics however symptoms have not resolved.

## 2018-03-14 ENCOUNTER — Other Ambulatory Visit: Payer: Self-pay | Admitting: Family Medicine

## 2018-03-14 ENCOUNTER — Other Ambulatory Visit (HOSPITAL_COMMUNITY)
Admission: RE | Admit: 2018-03-14 | Discharge: 2018-03-14 | Disposition: A | Discharge status: Home / Self Care | Payer: BLUE CROSS/BLUE SHIELD | Source: Ambulatory Visit | Attending: Family Medicine | Admitting: Family Medicine

## 2018-03-14 DIAGNOSIS — Z124 Encounter for screening for malignant neoplasm of cervix: Secondary | ICD-10-CM | POA: Diagnosis present

## 2018-03-19 LAB — CYTOLOGY - PAP
Adequacy: ABSENT
Diagnosis: NEGATIVE
HPV (WINDOPATH): NOT DETECTED

## 2018-04-30 ENCOUNTER — Emergency Department (HOSPITAL_COMMUNITY)
Admission: EM | Admit: 2018-04-30 | Discharge: 2018-04-30 | Disposition: A | Discharge status: Home / Self Care | Payer: BLUE CROSS/BLUE SHIELD | Attending: Emergency Medicine | Admitting: Emergency Medicine

## 2018-04-30 ENCOUNTER — Encounter (HOSPITAL_COMMUNITY): Payer: Self-pay | Admitting: Emergency Medicine

## 2018-04-30 ENCOUNTER — Emergency Department (HOSPITAL_COMMUNITY): Payer: BLUE CROSS/BLUE SHIELD

## 2018-04-30 ENCOUNTER — Other Ambulatory Visit: Payer: Self-pay

## 2018-04-30 DIAGNOSIS — Z79899 Other long term (current) drug therapy: Secondary | ICD-10-CM | POA: Insufficient documentation

## 2018-04-30 DIAGNOSIS — R1031 Right lower quadrant pain: Secondary | ICD-10-CM | POA: Diagnosis present

## 2018-04-30 LAB — URINALYSIS, ROUTINE W REFLEX MICROSCOPIC
Bilirubin Urine: NEGATIVE
Glucose, UA: NEGATIVE mg/dL
HGB URINE DIPSTICK: NEGATIVE
KETONES UR: NEGATIVE mg/dL
Leukocytes, UA: NEGATIVE
Nitrite: NEGATIVE
Protein, ur: NEGATIVE mg/dL
SPECIFIC GRAVITY, URINE: 1.021 (ref 1.005–1.030)
pH: 6 (ref 5.0–8.0)

## 2018-04-30 LAB — COMPREHENSIVE METABOLIC PANEL
ALBUMIN: 4.3 g/dL (ref 3.5–5.0)
ALT: 9 U/L (ref 0–44)
AST: 19 U/L (ref 15–41)
Alkaline Phosphatase: 30 U/L — ABNORMAL LOW (ref 38–126)
Anion gap: 9 (ref 5–15)
BILIRUBIN TOTAL: 0.7 mg/dL (ref 0.3–1.2)
BUN: 20 mg/dL (ref 6–20)
CALCIUM: 9.4 mg/dL (ref 8.9–10.3)
CO2: 24 mmol/L (ref 22–32)
CREATININE: 0.69 mg/dL (ref 0.44–1.00)
Chloride: 103 mmol/L (ref 98–111)
GFR calc Af Amer: >60 mL/min (ref >60)
GLUCOSE: 97 mg/dL (ref 70–99)
Potassium: 4 mmol/L (ref 3.5–5.1)
Sodium: 136 mmol/L (ref 135–145)
TOTAL PROTEIN: 8.1 g/dL (ref 6.5–8.1)

## 2018-04-30 LAB — I-STAT BETA HCG BLOOD, ED (MC, WL, AP ONLY)

## 2018-04-30 LAB — CBC
HCT: 37.3 % (ref 36.0–46.0)
HEMOGLOBIN: 12.2 g/dL (ref 12.0–15.0)
MCH: 29.6 pg (ref 26.0–34.0)
MCHC: 32.7 g/dL (ref 30.0–36.0)
MCV: 90.5 fL (ref 80.0–100.0)
Platelets: 303 10*3/uL (ref 150–400)
RBC: 4.12 MIL/uL (ref 3.87–5.11)
RDW: 12.9 % (ref 11.5–15.5)
WBC: 16.4 10*3/uL — AB (ref 4.0–10.5)
nRBC: 0 % (ref 0.0–0.2)

## 2018-04-30 LAB — LIPASE, BLOOD: Lipase: 36 U/L (ref 11–51)

## 2018-04-30 MED ORDER — IOPAMIDOL (ISOVUE-300) INJECTION 61%
INTRAVENOUS | Status: AC
  Filled 2018-04-30: qty 100

## 2018-04-30 MED ORDER — ONDANSETRON HCL 4 MG/2ML IJ SOLN
4.0000 mg | Freq: Once | INTRAMUSCULAR | Status: AC
  Administered 2018-04-30: 4 mg via INTRAVENOUS
  Filled 2018-04-30: qty 2

## 2018-04-30 MED ORDER — HYDROCODONE-ACETAMINOPHEN 5-325 MG PO TABS: 1.0000 | ORAL_TABLET | Freq: Four times a day (QID) | ORAL | 0 refills | Status: DC | PRN

## 2018-04-30 MED ORDER — IOPAMIDOL (ISOVUE-300) INJECTION 61%
100.0000 mL | Freq: Once | INTRAVENOUS | Status: AC | PRN
  Administered 2018-04-30: 100 mL via INTRAVENOUS

## 2018-04-30 MED ORDER — FENTANYL CITRATE (PF) 100 MCG/2ML IJ SOLN
100.0000 ug | Freq: Once | INTRAMUSCULAR | Status: AC
  Administered 2018-04-30: 100 ug via INTRAVENOUS
  Filled 2018-04-30: qty 2

## 2018-04-30 MED ORDER — SODIUM CHLORIDE (PF) 0.9 % IJ SOLN
INTRAMUSCULAR | Status: AC
  Filled 2018-04-30: qty 50

## 2018-04-30 MED ORDER — ONDANSETRON 8 MG PO TBDP: 8.0000 mg | ORAL_TABLET | Freq: Three times a day (TID) | ORAL | 0 refills | Status: DC | PRN

## 2018-04-30 MED ORDER — ONDANSETRON 4 MG PO TBDP
4.0000 mg | ORAL_TABLET | Freq: Once | ORAL | Status: DC | PRN
  Filled 2018-04-30: qty 1

## 2018-04-30 NOTE — ED Provider Notes (Signed)
[facility]-EMERGENCY DEPT Provider Note: Lowella DellJ. Lane Damon Hargrove, MD, FACEP  CSN: [id number] MRN: [id number] ARRIVAL: 04/30/18 at 0026 ROOM: [facility]   CHIEF COMPLAINT  Abdominal Pain   HISTORY OF PRESENT ILLNESS  04/30/18 3:07 AM Traci Campbell is a 22 y.o. female who developed abdominal pain yesterday evening about 8 PM.  The pain originated in the epigastrium and periumbilical area but has now moved to the right side.  She describes the pain as sharp and rates it as a 6 out of 10.  Is worse with movement or palpation she acknowledges chills nausea but denies vomiting, diarrhea, dysuria, hematuria, vaginal bleeding or vaginal discharge.    History reviewed. No pertinent past medical history.  History reviewed. No pertinent surgical history.  No family history on file.  Social History   Tobacco Use  . Smoking status: Never Smoker  . Smokeless tobacco: Never Used  Substance Use Topics  . Alcohol use: Yes  . Drug use: No    Prior to Admission medications   Medication Sig Start Date End Date Taking? Authorizing Provider  norelgestromin-ethinyl estradiol (ORTHO EVRA) 150-35 MCG/24HR transdermal patch Place 1 patch onto the skin once a week.   Yes [provider]  clindamycin (CLEOCIN) 150 MG capsule Take 1 capsule (150 mg total) by mouth every 6 (six) hours. Patient not taking: Reported on 04/30/2018 10/19/16   Mancel BaleWentz, Elliott, MD  HYDROcodone-acetaminophen [facility]) 5-325 MG tablet Take 2 tablets by mouth every 4 (four) hours as needed. Patient not taking: Reported on 04/30/2018 10/19/16   Mancel BaleWentz, Elliott, MD    Allergies Patient has no known allergies.   REVIEW OF SYSTEMS  Negative except as noted here or in the History of Present Illness.   PHYSICAL EXAMINATION  Initial Vital Signs Blood pressure 122/71, pulse (!) 110, temperature 98.4 F (36.9 C), temperature source Oral, resp. rate 16, last menstrual period 04/17/2018, SpO2 100 %.  Examination General: Well-developed,  well-nourished female in no acute distress; appearance consistent with age of record HENT: normocephalic; atraumatic Eyes: pupils equal, round and reactive to light; extraocular muscles intact Neck: supple Heart: regular rate and rhythm Lungs: clear to auscultation bilaterally Abdomen: soft; nondistended; diffuse abdominal tenderness most prominent in the right lower quadrant; no masses or hepatosplenomegaly; bowel sounds present Extremities: No deformity; full range of motion; pulses normal Neurologic: Awake, alert and oriented; motor function intact in all extremities and symmetric; no facial droop Skin: Warm and dry Psychiatric: Normal mood and affect   RESULTS  Summary of this visit's results, reviewed by myself:   EKG Interpretation  Date/Time:    Ventricular Rate:    PR Interval:    QRS Duration:   QT Interval:    QTC Calculation:   R Axis:     Text Interpretation:        Laboratory Studies: Results for orders placed or performed during the hospital encounter of 04/30/18 (from the past 24 hour(s))  Urinalysis, Routine w reflex microscopic     Status: None   Collection Time: 04/30/18  1:38 AM  Result Value Ref Range   Color, Urine YELLOW YELLOW   APPearance CLEAR CLEAR   Specific Gravity, Urine 1.021 1.005 - 1.030   pH 6.0 5.0 - 8.0   Glucose, UA NEGATIVE NEGATIVE mg/dL   Hgb urine dipstick NEGATIVE NEGATIVE   Bilirubin Urine NEGATIVE NEGATIVE   Ketones, ur NEGATIVE NEGATIVE mg/dL   Protein, ur NEGATIVE NEGATIVE mg/dL   Nitrite NEGATIVE NEGATIVE   Leukocytes, UA NEGATIVE NEGATIVE  Lipase,  blood     Status: None   Collection Time: 04/30/18  2:26 AM  Result Value Ref Range   Lipase 36 11 - 51 U/L  Comprehensive metabolic panel     Status: Abnormal   Collection Time: 04/30/18  2:26 AM  Result Value Ref Range   Sodium 136 135 - 145 mmol/L   Potassium 4.0 3.5 - 5.1 mmol/L   Chloride 103 98 - 111 mmol/L   CO2 24 22 - 32 mmol/L   Glucose, Bld 97 70 - 99 mg/dL    BUN 20 6 - 20 mg/dL   Creatinine, Ser 1.61 0.44 - 1.00 mg/dL   Calcium 9.4 8.9 - 09.6 mg/dL   Total Protein 8.1 6.5 - 8.1 g/dL   Albumin 4.3 3.5 - 5.0 g/dL   AST 19 15 - 41 U/L   ALT 9 0 - 44 U/L   Alkaline Phosphatase 30 (L) 38 - 126 U/L   Total Bilirubin 0.7 0.3 - 1.2 mg/dL   GFR calc non Af Amer >60 >60 mL/min   GFR calc Af Amer >60 >60 mL/min   Anion gap 9 5 - 15  CBC     Status: Abnormal   Collection Time: 04/30/18  2:26 AM  Result Value Ref Range   WBC 16.4 (H) 4.0 - 10.5 K/uL   RBC 4.12 3.87 - 5.11 MIL/uL   Hemoglobin 12.2 12.0 - 15.0 g/dL   HCT 04.5 40.9 - 81.1 %   MCV 90.5 80.0 - 100.0 fL   MCH 29.6 26.0 - 34.0 pg   MCHC 32.7 30.0 - 36.0 g/dL   RDW 91.4 78.2 - 95.6 %   Platelets 303 150 - 400 K/uL   nRBC 0.0 0.0 - 0.2 %  I-Stat beta hCG blood, ED     Status: None   Collection Time: 04/30/18  2:48 AM  Result Value Ref Range   I-stat hCG, quantitative <5.0 <5 mIU/mL   Comment 3           Imaging Studies: US Pelvis Transvanginal Non-ob (tv Only)  Result Date: 04/30/2018 CLINICAL DATA:  Initial evaluation for acute right lower quadrant pain. EXAM: TRANSABDOMINAL AND TRANSVAGINAL ULTRASOUND OF PELVIS DOPPLER ULTRASOUND OF OVARIES TECHNIQUE: Both transabdominal and transvaginal ultrasound examinations of the pelvis were performed. Transabdominal technique was performed for global imaging of the pelvis including uterus, ovaries, adnexal regions, and pelvic cul-de-sac. It was necessary to proceed with endovaginal exam following the transabdominal exam to visualize the uterus, endometrium, and ovaries. Color and duplex Doppler ultrasound was utilized to evaluate blood flow to the ovaries. COMPARISON:  None. FINDINGS: Uterus Measurements: 7.5 x 4.1 x 5.4 cm = volume: A 7.7 mL. No fibroids or other mass visualized. Endometrium Thickness: 2 mm at the level of the uterine fundus and body. Endometrium is diffusely prominent measuring up to 14 mm in maximal thickness at the level of the  lower uterine segment. Right ovary Measurements: 2.5 x 1.5 x 3.1 cm = volume: 6.0 mL. Normal appearance/no adnexal mass. Left ovary Measurements: 2.9 x 1.2 x 2.3 cm = volume: 4.3 mL. Normal appearance/no adnexal mass. Pulsed Doppler evaluation of both ovaries demonstrates normal low-resistance arterial and venous waveforms. Other findings Trace free physiologic fluid within the pelvis. IMPRESSION: 1. Focal prominence and thickening of the endometrium at the level of the lower uterine segment measuring up to 14 mm in thickness. Finding is of uncertain significance. A follow-up ultrasound in 6-12 weeks to evaluate stability and/or resolution suggested. Additionally, further assessment  with dedicated sonohysterography to evaluate for possible occult underlying lesion could be performed as clinically warranted. 2. Otherwise unremarkable and normal pelvic ultrasound. No evidence for torsion or other acute abnormality. Electronically Signed   By: Rise Mu M.D.   On: 04/30/2018 04:50   US Pelvis Complete  Result Date: 04/30/2018 CLINICAL DATA:  Initial evaluation for acute right lower quadrant pain. EXAM: TRANSABDOMINAL AND TRANSVAGINAL ULTRASOUND OF PELVIS DOPPLER ULTRASOUND OF OVARIES TECHNIQUE: Both transabdominal and transvaginal ultrasound examinations of the pelvis were performed. Transabdominal technique was performed for global imaging of the pelvis including uterus, ovaries, adnexal regions, and pelvic cul-de-sac. It was necessary to proceed with endovaginal exam following the transabdominal exam to visualize the uterus, endometrium, and ovaries. Color and duplex Doppler ultrasound was utilized to evaluate blood flow to the ovaries. COMPARISON:  None. FINDINGS: Uterus Measurements: 7.5 x 4.1 x 5.4 cm = volume: A 7.7 mL. No fibroids or other mass visualized. Endometrium Thickness: 2 mm at the level of the uterine fundus and body. Endometrium is diffusely prominent measuring up to 14 mm in maximal  thickness at the level of the lower uterine segment. Right ovary Measurements: 2.5 x 1.5 x 3.1 cm = volume: 6.0 mL. Normal appearance/no adnexal mass. Left ovary Measurements: 2.9 x 1.2 x 2.3 cm = volume: 4.3 mL. Normal appearance/no adnexal mass. Pulsed Doppler evaluation of both ovaries demonstrates normal low-resistance arterial and venous waveforms. Other findings Trace free physiologic fluid within the pelvis. IMPRESSION: 1. Focal prominence and thickening of the endometrium at the level of the lower uterine segment measuring up to 14 mm in thickness. Finding is of uncertain significance. A follow-up ultrasound in 6-12 weeks to evaluate stability and/or resolution suggested. Additionally, further assessment with dedicated sonohysterography to evaluate for possible occult underlying lesion could be performed as clinically warranted. 2. Otherwise unremarkable and normal pelvic ultrasound. No evidence for torsion or other acute abnormality. Electronically Signed   By: Rise Mu M.D.   On: 04/30/2018 04:50   Ct Abdomen Pelvis W Contrast  Result Date: 04/30/2018 CLINICAL DATA:  Acute onset of generalized abdominal pain. Leukocytosis. EXAM: CT ABDOMEN AND PELVIS WITH CONTRAST TECHNIQUE: Multidetector CT imaging of the abdomen and pelvis was performed using the standard protocol following bolus administration of intravenous contrast. CONTRAST:  ISOVUE-300 IOPAMIDOL (ISOVUE-300) INJECTION 61% COMPARISON:  Pelvic ultrasound performed earlier today at 4:00 a.m. FINDINGS: Lower chest: The visualized lung bases are grossly clear. The visualized portions of the mediastinum are unremarkable. Hepatobiliary: The liver is unremarkable in appearance. The gallbladder is unremarkable in appearance. The common bile duct remains normal in caliber. Pancreas: The pancreas is within normal limits. Spleen: The spleen is unremarkable in appearance. Adrenals/Urinary Tract: The adrenal glands are unremarkable in  appearance. The kidneys are within normal limits. There is no evidence of hydronephrosis. No renal or ureteral stones are identified. No perinephric stranding is seen. Stomach/Bowel: The stomach is unremarkable in appearance. The small bowel is within normal limits. The appendix is normal in caliber, without evidence of appendicitis. The colon is unremarkable in appearance. Vascular/Lymphatic: The abdominal aorta is unremarkable in appearance. The inferior vena cava is grossly unremarkable. No retroperitoneal lymphadenopathy is seen. No pelvic sidewall lymphadenopathy is identified. Reproductive: The bladder is mildly distended and grossly unremarkable. The uterus is unremarkable in appearance. The ovaries are relatively symmetric. No suspicious adnexal masses are seen. Other: No additional soft tissue abnormalities are seen. Musculoskeletal: No acute osseous abnormalities are identified. The visualized musculature is unremarkable in appearance. IMPRESSION:  Unremarkable contrast-enhanced CT of the abdomen and pelvis. Electronically Signed   By: Roanna Raider M.D.   On: 04/30/2018 06:19   US Pelvic Doppler (torsion R/o Or Mass Arterial Flow)  Result Date: 04/30/2018 CLINICAL DATA:  Initial evaluation for acute right lower quadrant pain. EXAM: TRANSABDOMINAL AND TRANSVAGINAL ULTRASOUND OF PELVIS DOPPLER ULTRASOUND OF OVARIES TECHNIQUE: Both transabdominal and transvaginal ultrasound examinations of the pelvis were performed. Transabdominal technique was performed for global imaging of the pelvis including uterus, ovaries, adnexal regions, and pelvic cul-de-sac. It was necessary to proceed with endovaginal exam following the transabdominal exam to visualize the uterus, endometrium, and ovaries. Color and duplex Doppler ultrasound was utilized to evaluate blood flow to the ovaries. COMPARISON:  None. FINDINGS: Uterus Measurements: 7.5 x 4.1 x 5.4 cm = volume: A 7.7 mL. No fibroids or other mass visualized.  Endometrium Thickness: 2 mm at the level of the uterine fundus and body. Endometrium is diffusely prominent measuring up to 14 mm in maximal thickness at the level of the lower uterine segment. Right ovary Measurements: 2.5 x 1.5 x 3.1 cm = volume: 6.0 mL. Normal appearance/no adnexal mass. Left ovary Measurements: 2.9 x 1.2 x 2.3 cm = volume: 4.3 mL. Normal appearance/no adnexal mass. Pulsed Doppler evaluation of both ovaries demonstrates normal low-resistance arterial and venous waveforms. Other findings Trace free physiologic fluid within the pelvis. IMPRESSION: 1. Focal prominence and thickening of the endometrium at the level of the lower uterine segment measuring up to 14 mm in thickness. Finding is of uncertain significance. A follow-up ultrasound in 6-12 weeks to evaluate stability and/or resolution suggested. Additionally, further assessment with dedicated sonohysterography to evaluate for possible occult underlying lesion could be performed as clinically warranted. 2. Otherwise unremarkable and normal pelvic ultrasound. No evidence for torsion or other acute abnormality. Electronically Signed   By: Rise Mu M.D.   On: 04/30/2018 04:50    ED COURSE and MDM  Nursing notes and initial vitals signs, including pulse oximetry, reviewed.  Vitals:   04/30/18 0121 04/30/18 0337 04/30/18 0430  BP: 122/71 109/68 110/75  Pulse: (!) 110 82 94  Resp: 16 18 (!) 23  Temp: 98.4 F (36.9 C)    TempSrc: Oral    SpO2: 100% 100% 99%   4:57 AM Ultrasound was nondiagnostic.  We will obtain a CT scan to evaluate for appendicitis.  6:29 AM Patient advised of reassuring diagnostic studies.  We will refer to [facility] for follow-up regarding her endometrial thickening.  She was advised to return if her abdominal pain is worsening or not resolving in the next 24 to 48 hours.  Consultation with the [facility] state controlled substances database reveals the patient has received 1  prescription for hydrocodone for the past 2 years.   PROCEDURES    ED DIAGNOSES     ICD-10-CM   1. RLQ abdominal pain R10.31 US PELVIC DOPPLER (TORSION R/O OR MASS ARTERIAL FLOW)    US PELVIC DOPPLER (TORSION R/O OR MASS ARTERIAL FLOW)       Seairra Otani, MD 04/30/18 0630

## 2018-04-30 NOTE — ED Notes (Signed)
Ultrasound at bedside

## 2018-04-30 NOTE — ED Notes (Signed)
Topaz not working properly. Patient verbalized that it was okay for this RN to sign for discharge.

## 2018-04-30 NOTE — ED Triage Notes (Signed)
Pt arriving POV with generalized abdominal pain and nausea that began today. Pt reports the pain has progressively gotten worse

## 2018-05-23 ENCOUNTER — Ambulatory Visit (INDEPENDENT_AMBULATORY_CARE_PROVIDER_SITE_OTHER): Payer: BLUE CROSS/BLUE SHIELD | Admitting: Family Medicine

## 2018-05-23 ENCOUNTER — Encounter: Payer: Self-pay | Admitting: Family Medicine

## 2018-05-23 VITALS — BP 129/77 | HR 90 | Ht 67.0 in | Wt 141.2 lb

## 2018-05-23 DIAGNOSIS — Z113 Encounter for screening for infections with a predominantly sexual mode of transmission: Secondary | ICD-10-CM | POA: Diagnosis not present

## 2018-05-23 DIAGNOSIS — R9389 Abnormal findings on diagnostic imaging of other specified body structures: Secondary | ICD-10-CM | POA: Diagnosis not present

## 2018-05-23 NOTE — Progress Notes (Addendum)
Subjective:    Traci Campbell - 22 y.o. female MRN [id number]  Date of birth: [birth year]  HPI  Traci Campbell is a 22 y.o. G0P0000 female with no significant PMH or surgical hx here for abdominal pain after being referred by the [facility] due to an abnormal finding on pelvic ultrasound. She experienced gradual onset of RLQ abdominal pain on 11/12 that progressively worsened and migrated to her epigastric region. She had nausea but no vomiting, diarrhea, fever, or constipation. Her last menstrual period was 2 weeks before this episode. The pain started at 8pm and it resolved by the next morning with some residual right-sided muscular soreness. The pain has not recurred since.  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 No job as yet.   First period was at [age] [address]OA. She has regular, monthly periods with mild-moderate bleeding and no pain. She uses patches for birth control and has a female partner. Denies irregular vaginal bleeding, discharge, or itching.   In the [facility], she had a normal CBC, CMP, UA, blood glucose, lipase, and neg. BhCG. Pelvic ultrasound was notable for "Focal prominence and thickening of the endometrium at the level of the lower uterine segment measuring up to 14 mm in thickness. Finding is of uncertain significance."   OB History    Gravida  0   Para  0   Term  0   Preterm  0   AB  0   Living  0     SAB  0   TAB  0   Ectopic  0   Multiple  0   Live Births  0             Health Maintenance:  Normal pap on 03/14/18. Health Maintenance:  Health Maintenance Due  Topic Date Due  . HIV Screening  01/27/2009  . TETANUS/TDAP  01/27/2013  . INFLUENZA VACCINE  01/17/2018    -  reports that she has never smoked. She has never used smokeless tobacco. - Review of Systems: Per HPI. - Past Medical History: There are no active problems to display for this patient.  - Medications: reviewed and updated   Objective:   Physical Exam BP 129/77   Pulse 90    Ht 5\' 7"  (1.702 m)   Wt 141 lb 3.2 oz (64 kg)   LMP 05/17/2018 (Exact Date)   BMI 22.12 kg/m  Gen: NAD, alert, cooperative with exam, well-appearing HEENT: PERRL, clear conjunctiva, supple neck CV: Regular rate and rhythm Resp: non-labored Abd: SNTND, BS present, no guarding or organomegaly Skin: no rashes, normal turgor  Neuro: no gross deficits.  Psych: good insight, alert and oriented    Assessment & Plan:   She had an extensive work-up completed at the [facility] and does not have any gynecological complaints, changes in her periods, abnormal bleeding, or vaginal discharge. Reassured by the fact that her symptoms resolved without intervention and have not recurred. Her pain is consistent with an MSK strain or possibly Mittelschmerz given her associated muscle soreness and the timing of her pain relative to her periods. Will plan to follow-up with a repeat pelvic ultrasound on 06/10/18. Also completing STD testing due to her age.   Routine preventative health maintenance measures emphasized. Please refer to After Visit Summary for other counseling recommendations.   Return if symptoms worsen or fail to improve.  Trenda Mootslizabeth Tiquan Bouch MS3  05/23/2018, 2:54 PM   Patient seen in conjunction with the medical student.  I  have taken the history and preformed the exam.  The above note has been edited as necessary.  Reva Bores, MD  06/13/2018 8:59 AM

## 2018-05-24 NOTE — Patient Instructions (Signed)

## 2018-05-27 ENCOUNTER — Telehealth: Payer: Self-pay | Admitting: *Deleted

## 2018-05-27 NOTE — Telephone Encounter (Signed)
Called pt to request that she return to the office to provide either a urine sample or self swab to test for GC/Chlamydia as it does not appear that a previous sample was sent at her last visit to test for this.  Pt did not pick up.  Left message requesting the pt call the office.

## 2018-05-28 NOTE — Telephone Encounter (Signed)
Called pt to request that she return to the office to provide either a urine sample or self swab to test for GC/Chlamydia as it does not appear that a previous sample was sent at her last visit to test for this.  Pt did not pick up.  Left message requesting the pt call the office.  advised pt that I would send her a mychart message with these details to message us back on there if that was better for her.

## 2018-06-10 ENCOUNTER — Ambulatory Visit (HOSPITAL_COMMUNITY)
Admission: RE | Admit: 2018-06-10 | Discharge: 2018-06-10 | Disposition: A | Discharge status: Home / Self Care | Payer: BLUE CROSS/BLUE SHIELD | Source: Ambulatory Visit | Attending: Family Medicine | Admitting: Family Medicine

## 2018-06-10 DIAGNOSIS — R9389 Abnormal findings on diagnostic imaging of other specified body structures: Secondary | ICD-10-CM | POA: Diagnosis not present

## 2018-07-24 ENCOUNTER — Ambulatory Visit (INDEPENDENT_AMBULATORY_CARE_PROVIDER_SITE_OTHER): Payer: BLUE CROSS/BLUE SHIELD | Admitting: Family Medicine

## 2018-07-24 ENCOUNTER — Other Ambulatory Visit (HOSPITAL_COMMUNITY)
Admission: RE | Admit: 2018-07-24 | Discharge: 2018-07-24 | Disposition: A | Discharge status: Home / Self Care | Payer: BLUE CROSS/BLUE SHIELD | Source: Ambulatory Visit | Attending: Family Medicine | Admitting: Family Medicine

## 2018-07-24 ENCOUNTER — Encounter: Payer: Self-pay | Admitting: Family Medicine

## 2018-07-24 VITALS — BP 117/83 | HR 92 | Wt 142.0 lb

## 2018-07-24 DIAGNOSIS — R9389 Abnormal findings on diagnostic imaging of other specified body structures: Secondary | ICD-10-CM | POA: Insufficient documentation

## 2018-07-24 LAB — POCT PREGNANCY, URINE: Preg Test, Ur: NEGATIVE

## 2018-07-24 NOTE — Progress Notes (Signed)
   Subjective:    Patient ID: Traci Campbell is a 23 y.o. female presenting with No chief complaint on file.  on 07/24/2018  HPI: Here for f/u u/s. Has thickened endometrium/endocervical canal and recommended tissue sampling.  Review of Systems  Constitutional: Negative for chills and fever.  Respiratory: Negative for shortness of breath.   Cardiovascular: Negative for chest pain.  Gastrointestinal: Negative for abdominal pain, nausea and vomiting.  Genitourinary: Negative for dysuria.  Skin: Negative for rash.      Objective:    There were no vitals taken for this visit. Physical Exam Constitutional:      General: She is not in acute distress.    Appearance: She is well-developed.  HENT:     Head: Normocephalic and atraumatic.  Eyes:     General: No scleral icterus. Neck:     Musculoskeletal: Neck supple.  Cardiovascular:     Rate and Rhythm: Normal rate.  Pulmonary:     Effort: Pulmonary effort is normal.  Abdominal:     Palpations: Abdomen is soft.  Skin:    General: Skin is warm and dry.  Neurological:     Mental Status: She is alert and oriented to person, place, and time.    Procedure: Patient given informed consent, signed copy in the chart, time out was performed. The patient was placed in the lithotomy position and the cervix brought into view with sterile speculum.  Portio of cervix cleansed x 2 with betadine swabs.  A tenaculum was placed in the anterior lip of the cervix.  ECC obtained. The uterus was sounded for depth of 6 cm Os finder used, and may not have gotten pipelle into endometrium. A pipelle was introduced to into the uterus, suction created, and an endometrial/endocervical  sample was obtained. All equipment was removed and accounted for. The patient tolerated the procedure well.          Assessment & Plan:  , Problem List Items Addressed This Visit      Unprioritized   Endometrial thickening on ultrasound - Primary    Unclear etiology,  checking pathology today with ECC and EMB      Relevant Orders   Surgical pathology( [place]/ [facility])   Surgical pathology( [place]/ [facility])    results to be given via MyChart  Total face-to-face time with patient: 10 minutes. Over 50% of encounter was spent on counseling and coordination of care. Return if symptoms worsen or fail to improve.  Reva Bores 07/24/2018 8:55 AM

## 2018-07-24 NOTE — Assessment & Plan Note (Signed)
Unclear etiology, checking pathology today with ECC and EMB

## 2018-07-24 NOTE — Patient Instructions (Signed)

## 2018-11-18 IMAGING — US US TRANSVAGINAL NON-OB
1 series · 13 of 25 positions shown · non-contrast
Comparison: None.

CLINICAL DATA: Initial evaluation for acute right lower quadrant
pain.

EXAM:
TRANSABDOMINAL AND TRANSVAGINAL ULTRASOUND OF PELVIS
DOPPLER ULTRASOUND OF OVARIES
TECHNIQUE: Both transabdominal and transvaginal ultrasound examinations of the
pelvis were performed. Transabdominal technique was performed for
global imaging of the pelvis including uterus, ovaries, adnexal
regions, and pelvic cul-de-sac.
It was necessary to proceed with endovaginal exam following the
transabdominal exam to visualize the uterus, endometrium, and
ovaries. Color and duplex Doppler ultrasound was utilized to
evaluate blood flow to the ovaries.

[Series 1: us transvaginal non-ob · 0.18mm/px · 110 acquisitions, 13 frames shown]
[im 1/110]
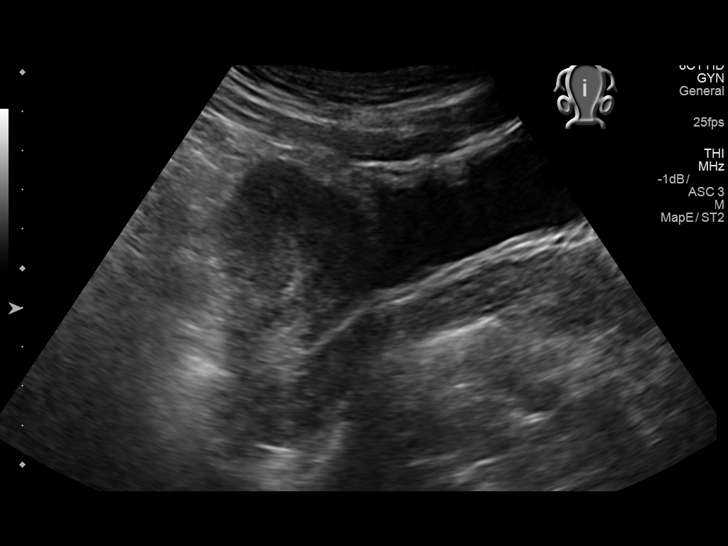
[im 10/110]
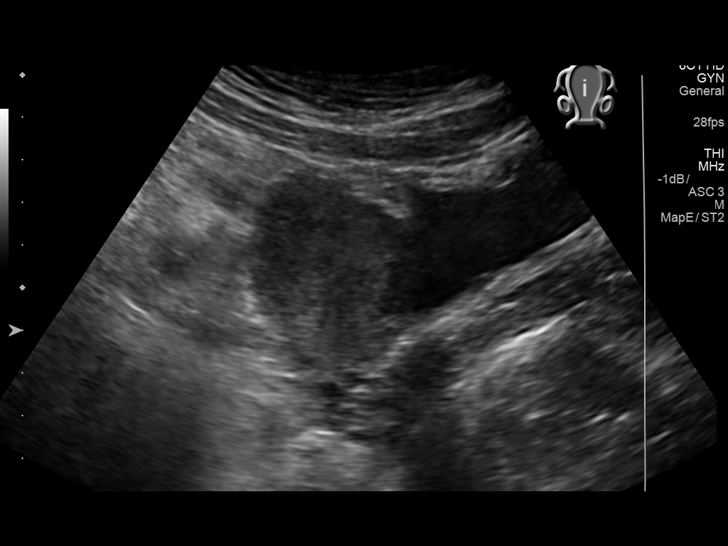
[im 19/110]
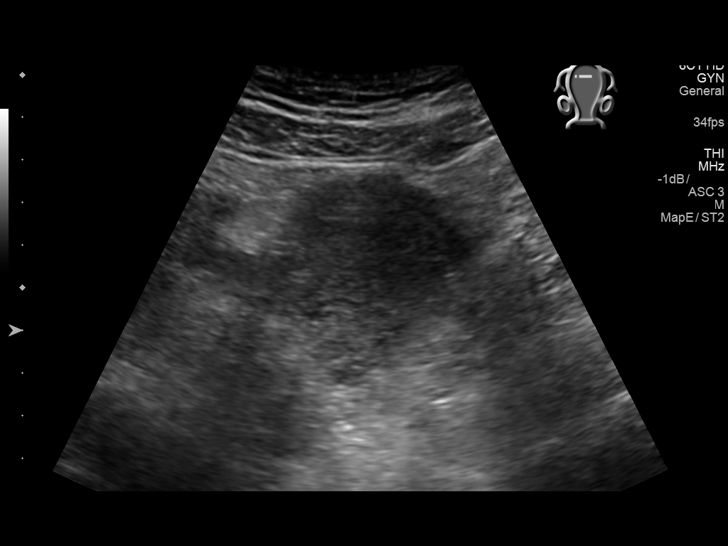
[im 28/110]
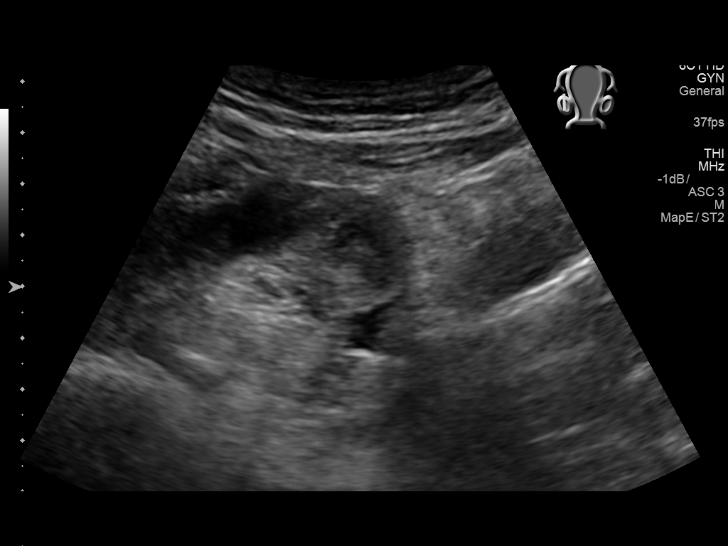
[im 37/110]
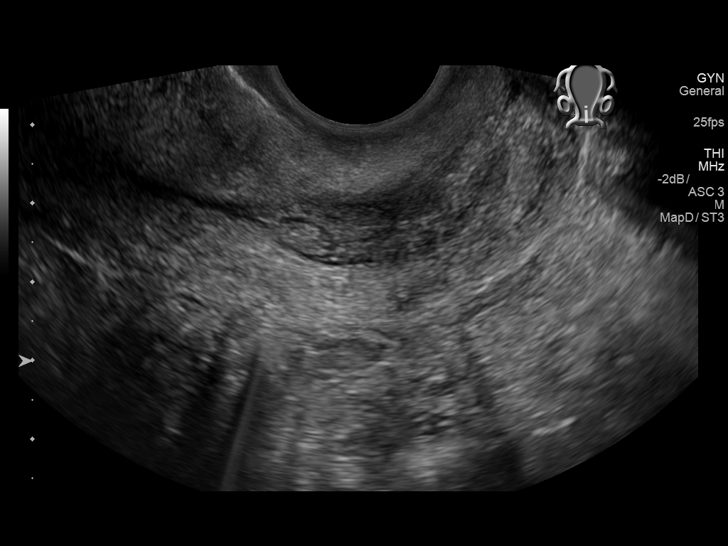
[im 46/110]
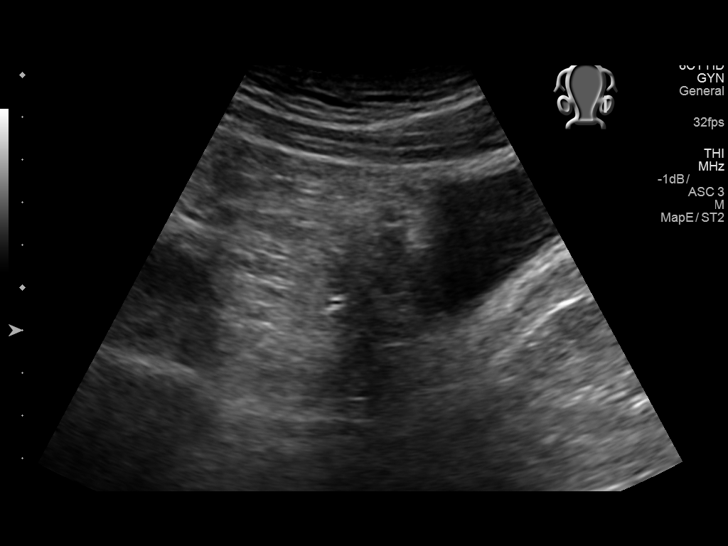
[im 55/110]
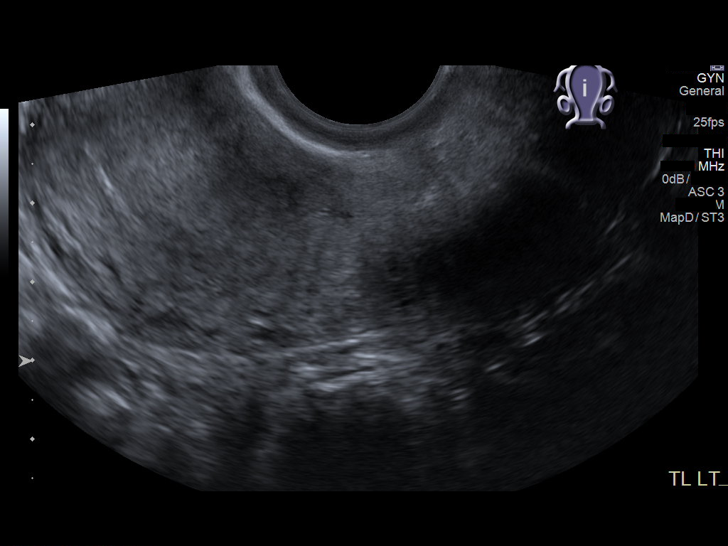
[im 64/110]
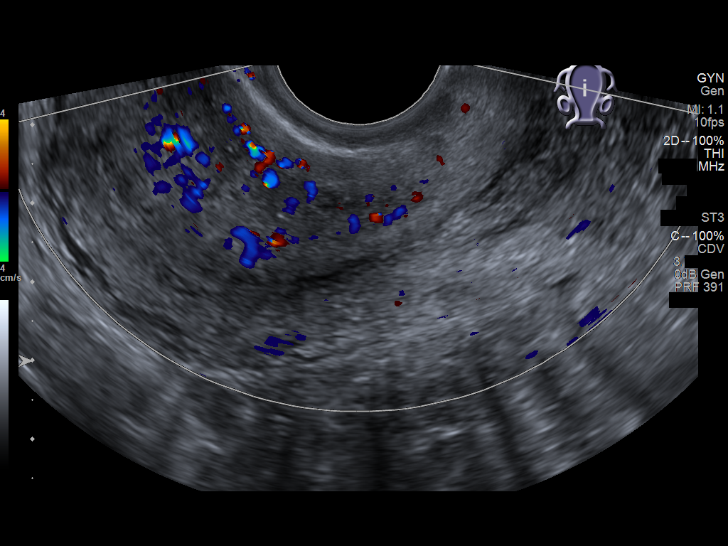
[im 73/110]
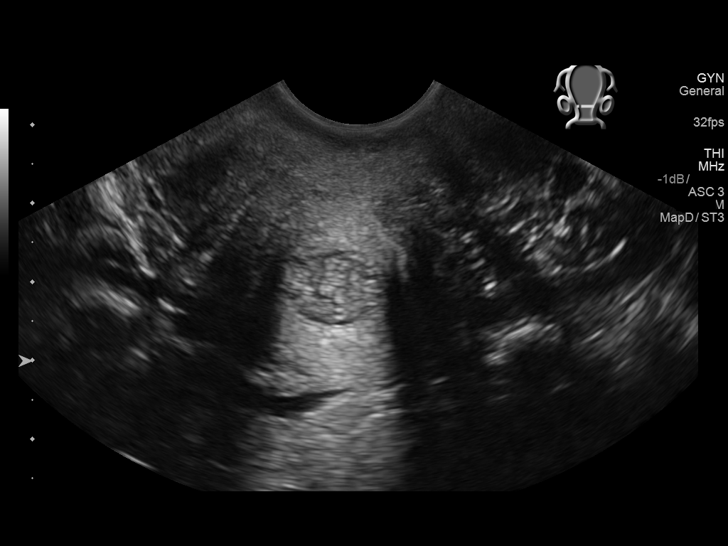
[im 82/110]
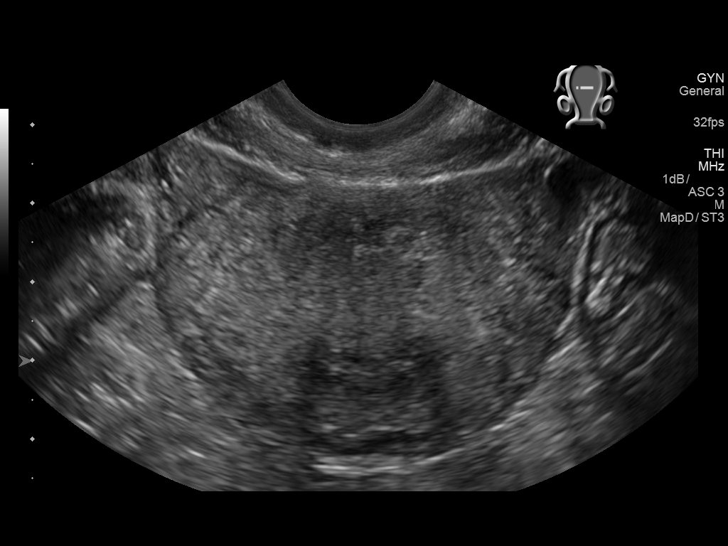
[im 91/110]
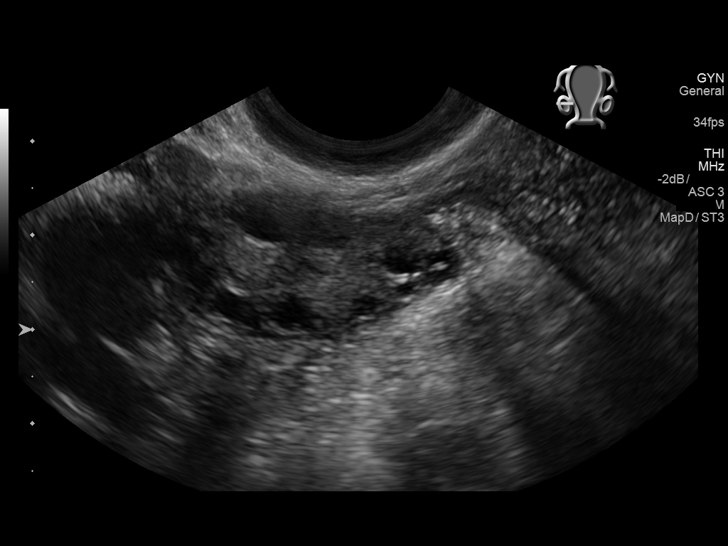
[im 100/110]
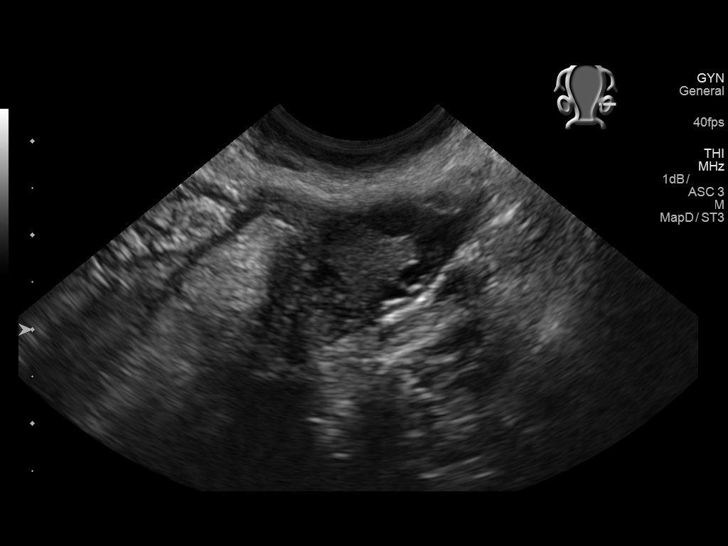
[im 110/110]
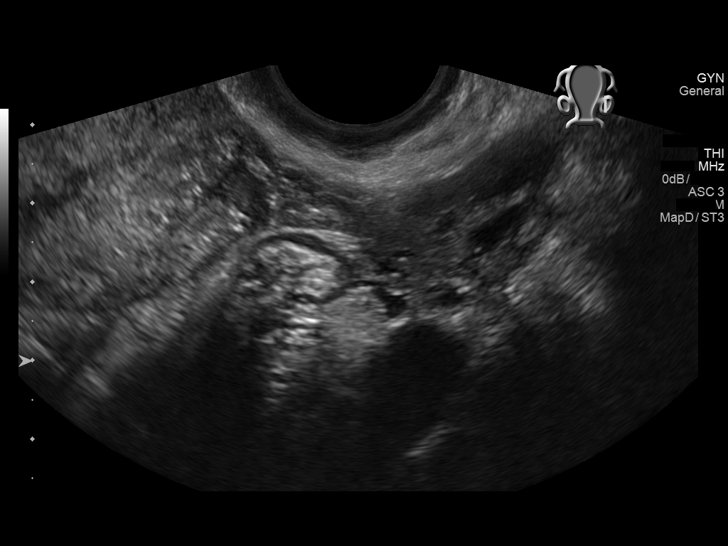

[13 of 25 positions shown; findings below may reference images not displayed]

FINDINGS: Uterus

Measurements: 7.5 x 4.1 x 5.4 cm = volume: A 7.7 mL. No fibroids or
other mass visualized.

Endometrium

Thickness: 2 mm at the level of the uterine fundus and body.
Endometrium is diffusely prominent measuring up to 14 mm in maximal
thickness at the level of the lower uterine segment.

Right ovary

Measurements: 2.5 x 1.5 x 3.1 cm = volume: 6.0 mL. Normal
appearance/no adnexal mass.

Left ovary

Measurements: 2.9 x 1.2 x 2.3 cm = volume: 4.3 mL. Normal
appearance/no adnexal mass.

Pulsed Doppler evaluation of both ovaries demonstrates normal
low-resistance arterial and venous waveforms.

Other findings

Trace free physiologic fluid within the pelvis.
IMPRESSION: 1. Focal prominence and thickening of the endometrium at the level
of the lower uterine segment measuring up to 14 mm in thickness.
Finding is of uncertain significance.. A follow-up ultrasound in
6-12 weeks to evaluate stability and/or resolution suggested.
Additionally, further assessment with dedicated sonohysterography to
evaluate for possible occult underlying lesion could be performed as
clinically warranted.
2. Otherwise unremarkable and normal pelvic ultrasound. No evidence
for torsion or other acute abnormality.

## 2019-04-28 ENCOUNTER — Telehealth: Payer: Self-pay

## 2019-04-28 ENCOUNTER — Encounter (INDEPENDENT_AMBULATORY_CARE_PROVIDER_SITE_OTHER): Payer: Self-pay

## 2019-04-28 ENCOUNTER — Telehealth: Payer: BLUE CROSS/BLUE SHIELD | Admitting: Physician Assistant

## 2019-04-28 DIAGNOSIS — R05 Cough: Secondary | ICD-10-CM

## 2019-04-28 DIAGNOSIS — R059 Cough, unspecified: Secondary | ICD-10-CM

## 2019-04-28 DIAGNOSIS — U071 COVID-19: Secondary | ICD-10-CM

## 2019-04-28 MED ORDER — BENZONATATE 100 MG PO CAPS: 100.0000 mg | ORAL_CAPSULE | Freq: Three times a day (TID) | ORAL | 0 refills | Status: DC | PRN

## 2019-04-28 NOTE — Progress Notes (Signed)
Your test for COVID-19 was positive, meaning that you were infected with the novel coronavirus and could give the germ to others.    You have been enrolled in [facility] for COVID-19. Daily you will receive a questionnaire within the [place] website. Our COVID-19 response team will be monitoring your responses daily.  Please continue isolation at home, for at least 10 days since the start of your symptoms and until you have had 24 hours with no fever (without taking a fever reducer) and with improving of symptoms.  Please continue good preventive care measures, including:  frequent hand-washing, avoid touching your face, cover coughs/sneezes, stay out of crowds and keep a 6 foot distance from others.  Recheck or go to the nearest hospital ED tent for re-assessment if fever/cough/breathlessness return.   COVID-19 is a respiratory illness with symptoms that are similar to the flu. Symptoms are typically mild to moderate, but there have been cases of severe illness and death due to the virus. The following symptoms may appear 2-14 days after exposure: . Fever . Cough . Shortness of breath or difficulty breathing . Chills . Repeated shaking with chills . Muscle pain . Headache . Sore throat . New loss of taste or smell . Fatigue . Congestion or runny nose . Nausea or vomiting . Diarrhea  It is vitally important that if you feel that you have an infection such as this virus or any other virus that you stay home and away from places where you may spread it to others.  You should self-quarantine for 14 days if you have symptoms that could potentially be coronavirus or have been in close contact a with a person diagnosed with COVID-19 within the last 2 weeks. You should avoid contact with people age 25 and older.   You should wear a mask or cloth face covering over your nose and mouth if you must be around other people or animals, including pets (even at home). Try to stay at least 6  feet away from other people. This will protect the people around you.  You can use medication such as A prescription cough medication called Tessalon Perles 100 mg. You may take 1-2 capsules every 8 hours as needed for cough  You may also take acetaminophen (Tylenol) as needed for fever.   Reduce your risk of any infection by using the same precautions used for avoiding the common cold or flu:  [name] Wash your hands often with soap and warm water for at least 20 seconds.  If soap and water are not readily available, use an alcohol-based hand sanitizer with at least 60% alcohol.  . If coughing or sneezing, cover your mouth and nose by coughing or sneezing into the elbow areas of your shirt or coat, into a tissue or into your sleeve (not your hands). . Avoid shaking hands with others and consider head nods or verbal greetings only. . Avoid touching your eyes, nose, or mouth with unwashed hands.  . Avoid close contact with people who are sick. . Avoid places or events with large numbers of people in one location, like concerts or sporting events. . Carefully consider travel plans you have or are making. . If you are planning any travel outside or inside the [place], visit the CDC's Travelers' Health webpage for the latest health notices. . If you have some symptoms but not all symptoms, continue to monitor at home and seek medical attention if your symptoms worsen. . If you are having a  medical emergency, call 911.  HOME CARE . Only take medications as instructed by your medical team. . Drink plenty of fluids and get plenty of rest. . A steam or ultrasonic humidifier can help if you have congestion.   GET HELP RIGHT AWAY IF YOU HAVE EMERGENCY WARNING SIGNS** FOR COVID-19. If you or someone is showing any of these signs seek emergency medical care immediately. Call 911 or proceed to your closest emergency facility if: . You develop worsening high fever. . Trouble breathing . Bluish lips or  face . Persistent pain or pressure in the chest . New confusion . Inability to wake or stay awake . You cough up blood. . Your symptoms become more severe  **This list is not all possible symptoms. Contact your medical provider for any symptoms that are sever or concerning to you.   MAKE SURE YOU   Understand these instructions.  Will watch your condition.  Will get help right away if you are not doing well or get worse.   Greater than 5 minutes, yet less than 10 minutes of time have been spent researching, coordinating and implementing care for this patient today.    Your e-visit answers were reviewed by a board certified advanced clinical practitioner to complete your personal care plan.  Depending on the condition, your plan could have included both over the counter or prescription medications.  If there is a problem please reply once you have received a response from your provider.  Your safety is important to [place].  If you have drug allergies check your prescription carefully.    You can use MyChart to ask questions about today's visit, request a non-urgent call back, or ask for a work or school excuse for 24 hours related to this e-Visit. If it has been greater than 24 hours you will need to follow up with your provider, or enter a new e-Visit to address those concerns. You will get an e-mail in the next two days asking about your experience.  I hope that your e-visit has been valuable and will speed your recovery. Thank you for using e-visits.

## 2019-04-28 NOTE — Telephone Encounter (Signed)
Patient advise on weakness per protocol:   IF PATIENT HAS WORSENING WEAKNESS WITH INABILITY TO STAND OR IF PATIENT HAS TO HOLD ON TO SOMETHING TO GET BALANCE, ADVISE PATIENT TO CALL 911 AND SEEK TREATMENT IN ED  Patient states that he weakness is not severe and that she just has to take her time when doing things

## 2019-04-29 ENCOUNTER — Encounter (INDEPENDENT_AMBULATORY_CARE_PROVIDER_SITE_OTHER): Payer: Self-pay

## 2019-04-30 ENCOUNTER — Encounter (INDEPENDENT_AMBULATORY_CARE_PROVIDER_SITE_OTHER): Payer: Self-pay

## 2019-05-01 ENCOUNTER — Encounter (INDEPENDENT_AMBULATORY_CARE_PROVIDER_SITE_OTHER): Payer: Self-pay

## 2019-05-02 ENCOUNTER — Encounter (INDEPENDENT_AMBULATORY_CARE_PROVIDER_SITE_OTHER): Payer: Self-pay

## 2019-05-03 ENCOUNTER — Encounter (INDEPENDENT_AMBULATORY_CARE_PROVIDER_SITE_OTHER): Payer: Self-pay

## 2019-05-04 ENCOUNTER — Encounter (INDEPENDENT_AMBULATORY_CARE_PROVIDER_SITE_OTHER): Payer: Self-pay

## 2019-05-05 ENCOUNTER — Encounter (INDEPENDENT_AMBULATORY_CARE_PROVIDER_SITE_OTHER): Payer: Self-pay

## 2019-05-06 ENCOUNTER — Encounter (INDEPENDENT_AMBULATORY_CARE_PROVIDER_SITE_OTHER): Payer: Self-pay

## 2019-05-07 ENCOUNTER — Encounter (INDEPENDENT_AMBULATORY_CARE_PROVIDER_SITE_OTHER): Payer: Self-pay

## 2019-05-08 ENCOUNTER — Encounter (INDEPENDENT_AMBULATORY_CARE_PROVIDER_SITE_OTHER): Payer: Self-pay

## 2019-05-09 ENCOUNTER — Encounter (INDEPENDENT_AMBULATORY_CARE_PROVIDER_SITE_OTHER): Payer: Self-pay

## 2019-05-10 ENCOUNTER — Encounter (INDEPENDENT_AMBULATORY_CARE_PROVIDER_SITE_OTHER): Payer: Self-pay

## 2019-05-11 ENCOUNTER — Encounter (INDEPENDENT_AMBULATORY_CARE_PROVIDER_SITE_OTHER): Payer: Self-pay

## 2020-05-25 ENCOUNTER — Ambulatory Visit (INDEPENDENT_AMBULATORY_CARE_PROVIDER_SITE_OTHER): Payer: BC Managed Care – PPO | Admitting: Nurse Practitioner

## 2020-05-25 ENCOUNTER — Other Ambulatory Visit: Payer: Self-pay

## 2020-05-25 ENCOUNTER — Encounter: Payer: Self-pay | Admitting: Nurse Practitioner

## 2020-05-25 VITALS — BP 128/86 | HR 83 | Wt 137.7 lb

## 2020-05-25 DIAGNOSIS — Z3009 Encounter for other general counseling and advice on contraception: Secondary | ICD-10-CM

## 2020-05-25 MED ORDER — CYCLOBENZAPRINE HCL 10 MG PO TABS: ORAL_TABLET | ORAL | 0 refills | Status: DC

## 2020-05-25 MED ORDER — MISOPROSTOL 200 MCG PO TABS: 400.0000 ug | ORAL_TABLET | Freq: Once | ORAL | 0 refills | Status: DC

## 2020-05-25 NOTE — Progress Notes (Signed)
   GYNECOLOGY OFFICE VISIT NOTE   History:  24 y.o. G0P0000 here today for IUD insertion but has questions about the IUD.  Currently she is on the patch.. She denies any abnormal vaginal discharge, bleeding, pelvic pain or other concerns.   History reviewed. No pertinent past medical history.  History reviewed. No pertinent surgical history.  The following portions of the patient's history were reviewed and updated as appropriate: allergies, current medications, past family history, past medical history, past social history, past surgical history and problem list.   Health Maintenance:  Normal pap on 08-2017    Review of Systems:  Pertinent items noted in HPI and remainder of comprehensive ROS otherwise negative.  Objective:  Physical Exam BP 128/86   Pulse 83   Wt 137 lb 11.2 oz (62.5 kg)   LMP 05/20/2020   BMI 21.57 kg/m  CONSTITUTIONAL: Well-developed, well-nourished female in no acute distress.  HENT:  Normocephalic, atraumatic. External right and left ear normal.  EYES: Conjunctivae and EOM are normal. Pupils are equal, round.  No scleral icterus.  NECK: Normal range of motion, supple, no masses SKIN: Skin is warm and dry. No rash noted. Not diaphoretic. No erythema. No pallor. NEUROLOGIC: Alert and oriented to person, place, and time. Normal muscle tone coordination. No cranial nerve deficit noted. PSYCHIATRIC: Normal mood and affect. Normal behavior. Normal judgment and thought content. CARDIOVASCULAR: Normal heart rate noted, no murmur RESPIRATORY: Effort and breath sounds normal, no problems with respiration noted, clear to asculatation ABDOMEN: Soft, no distention noted.   PELVIC: Deferred MUSCULOSKELETAL: Normal range of motion. No edema noted.  Labs and Imaging No results found.  Assessment & Plan:  1. Family planning counseling Wants Liletta IUD - reviewed associated risks of expulsion, imbedment and perforation of the uterus as well as benefits of excellent  contraction Has not had a pregnancy Will prescribe flexeril and cytotec 400 mg PO to be taken 4-6 hours before her appointment.   Routine preventative health maintenance measures emphasized. Please refer to After Visit Summary for other counseling recommendations.   Return in about 1 week (around 06/01/2020) for IUD insertion at client's convenience.   Total face-to-face time with patient: 15 minutes.  Over 50% of encounter was spent on counseling and coordination of care.  Nolene Bernheim, RN, MSN, NP-BC Nurse Practitioner, [facility] for [employer], [facility] 05/25/2020 9:32 PM

## 2020-06-08 ENCOUNTER — Ambulatory Visit (INDEPENDENT_AMBULATORY_CARE_PROVIDER_SITE_OTHER): Payer: BC Managed Care – PPO | Admitting: Family Medicine

## 2020-06-08 ENCOUNTER — Encounter: Payer: Self-pay | Admitting: Family Medicine

## 2020-06-08 ENCOUNTER — Other Ambulatory Visit: Payer: Self-pay

## 2020-06-08 VITALS — BP 112/74 | HR 87 | Wt 143.0 lb

## 2020-06-08 DIAGNOSIS — Z538 Procedure and treatment not carried out for other reasons: Secondary | ICD-10-CM | POA: Insufficient documentation

## 2020-06-08 DIAGNOSIS — Z3202 Encounter for pregnancy test, result negative: Secondary | ICD-10-CM | POA: Diagnosis not present

## 2020-06-08 DIAGNOSIS — Z3042 Encounter for surveillance of injectable contraceptive: Secondary | ICD-10-CM

## 2020-06-08 LAB — POCT PREGNANCY, URINE: Preg Test, Ur: NEGATIVE

## 2020-06-08 MED ORDER — MEDROXYPROGESTERONE ACETATE 150 MG/ML IM SUSP
150.0000 mg | Freq: Once | INTRAMUSCULAR | Status: AC
  Administered 2020-06-08: 150 mg via INTRAMUSCULAR

## 2020-06-08 NOTE — Addendum Note (Signed)
Addended by: Henrietta Dine on: 06/08/2020 04:52 PM   Modules accepted: Orders

## 2020-06-08 NOTE — Patient Instructions (Signed)
Contraceptive Injection A contraceptive injection is a shot that prevents pregnancy. It is also called the birth control shot. The shot contains the hormone progestin, which prevents pregnancy by:  Stopping the ovaries from releasing eggs.  Thickening cervical mucus to prevent sperm from entering the cervix.  Thinning the lining of the uterus to prevent a fertilized egg from attaching to the uterus. Contraceptive injections are given under the skin (subcutaneous) or into a muscle (intramuscular). For these shots to work, you must get one of them every 3 months (12 weeks) from a health care provider. Tell a health care provider about:  Any allergies you have.  All medicines you are taking, including vitamins, herbs, eye drops, creams, and over-the-counter medicines.  Any blood disorders you have.  Any medical conditions you have.  Whether you are pregnant or may be pregnant. What are the risks? Generally, this is a safe procedure. However, problems may occur, including:  Mood changes or depression.  Loss of bone density (osteoporosis) after long-term use.  Blood clots.  Higher risk of an egg being fertilized outside your uterus (ectopic pregnancy).This is rare. What happens before the procedure?  Your health care provider may do a routine physical exam.  You may have a test to make sure you are not pregnant. What happens during the procedure?  The area where the shot will be given will be cleaned and sanitized with alcohol.  A needle will be inserted into a muscle in your upper arm or buttock, or into the skin of your thigh or abdomen. The needle will be attached to a syringe with the medicine inside of it.  The medicine will be pushed through the syringe and injected into your body.  A small bandage (dressing) may be placed over the injection site. What can I expect after the procedure?  After the procedure, it is common to have: ? Soreness around the injection site for  a couple of days. ? Irregular menstrual bleeding. ? Weight gain. ? Breast tenderness. ? Headaches. ? Discomfort in your abdomen.  Ask your health care provider whether you need to use an added method of birth control (backup contraception), such as a condom, sponge, or spermicide. ? If the first shot is given 1-7 days after the start of your last period, you will not need backup contraception. ? If the first shot is given at any other time during your menstrual cycle, you should avoid having sex or you will need backup contraception for 7 days after you receive the shot. Follow these instructions at home: General instructions   Take over-the-counter and prescription medicines only as told by your health care provider.  Do not massage the injection site.  Track your menstrual periods so you will know if they become irregular.  Always use a condom to protect against STIs (sexually transmitted infections).  Make sure you schedule an appointment in time for your next shot, and mark it on your calendar. For the birth control to prevent pregnancy, you must get the injections every 3 months (12 weeks). Lifestyle  Do not use any products that contain nicotine or tobacco, such as cigarettes and e-cigarettes. If you need help quitting, ask your health care provider.  Eat foods that are high in calcium and vitamin D, such as milk, cheese, and salmon. Doing this may help with any loss in bone density that is caused by the contraceptive injection. Ask your health care provider for dietary recommendations. Contact a health care provider if:  You   have nausea or vomiting.  You have abnormal vaginal discharge or bleeding.  You miss a period or you think you might be pregnant.  You experience mood changes or depression.  You feel dizzy or light-headed.  You have leg pain. Get help right away if:  You have chest pain.  You cough up blood.  You have shortness of breath.  You have a  severe headache that does not go away.  You have numbness in any part of your body.  You have slurred speech.  You have vision problems.  You have vaginal bleeding that is abnormally heavy or does not stop.  You have severe pain in your abdomen.  You have depression that does not get better with treatment. If you ever feel like you may hurt yourself or others, or have thoughts about taking your own life, get help right away. You can go to your nearest emergency department or call:  Your local emergency services (911 in the [place]  A suicide crisis helpline, such as the National Suicide Prevention Lifeline at 1-800-273-8255. This is open 24 hours a day. Summary  A contraceptive injection is a shot that prevents pregnancy. It is also called the birth control shot.  The shot is given under the skin (subcutaneous) or into a muscle (intramuscular).  After this procedure, it is common to have soreness around the injection site for a couple of days.  To prevent pregnancy, the shot must be given by a health care provider every 3 months (12 weeks).  After you have the shot, ask your health care provider whether you need to use an added method of birth control (backup contraception), such as a condom, sponge, or spermicide. This information is not intended to replace advice given to you by your health care provider. Make sure you discuss any questions you have with your health care provider. Document Revised: 05/18/2017 Document Reviewed: 01/29/2017 Elsevier Patient Education  2020 Elsevier Inc.  

## 2020-06-08 NOTE — Progress Notes (Signed)
    GYNECOLOGY OFFICE PROCEDURE NOTE  EARLEE HERALD is a 24 y.o. G0P0000 here for [place] IUD insertion. No GYN concerns.  Last pap smear was on 02/2018 and was normal. Unprotected sex five days ago, desires emergency contraception, UPT negative.   IUD Insertion Procedure Note Patient identified, informed consent performed, consent signed.   Discussed risks of irregular bleeding, infection, malpositioning or misplacement of the IUD outside the uterus which may require further procedure such as laparoscopy. Also discussed >99% contraception efficacy, increased risk of ectopic pregnancy with failure of method.  Time out was performed.  Urine pregnancy test negative.  Speculum placed in the vagina.  Cervix visualized.  Cleaned with Betadine x 2.  Grasped anteriorly with a single tooth tenaculum.  Uterus sounded to 4 cm, unable to advance further. Speculum removed and bimanual performed, some retroversion to the uterus. Attempted to sound again in a posterior direction but met significant resistance and procedure was abandoned, as patient had taken cytotec and I did not believe it was very likely I had not passed through both cervical os. BSUS performed to attempt to assess the uterus but unable to visualize well transabdominally. Discussed options, ultimately elected for Depo, would like to pursue [place] to assess uterine anatomy and see if uterine cavity is actually large enough to place IUD.   Clarnce Flock, MD/[facility] for [employer], [place]

## 2020-06-08 NOTE — Progress Notes (Signed)
12.17.21

## 2020-08-05 IMAGING — CT CT ABD-PELV W/ CM
2 of 4 series · 15 of 46 positions shown, 17 images · IV contrast (ISOVUE)
Comparison: Pelvic ultrasound performed earlier today at [DATE] a.m.

CLINICAL DATA: Acute onset of generalized abdominal pain.
Leukocytosis.

EXAM:
CT ABDOMEN AND PELVIS WITH CONTRAST
TECHNIQUE: Multidetector CT imaging of the abdomen and pelvis was performed
using the standard protocol following bolus administration of
intravenous contrast.
CONTRAST:  100mL XOXV6Y-2DD IOPAMIDOL (XOXV6Y-2DD) INJECTION 61%

[Series 2: axial st · axial · 0.63mm/px · z∈[+1190,+1570]mm · 12 of 86 slices shown, 14 images]
[im 5/86  soft-tissue]
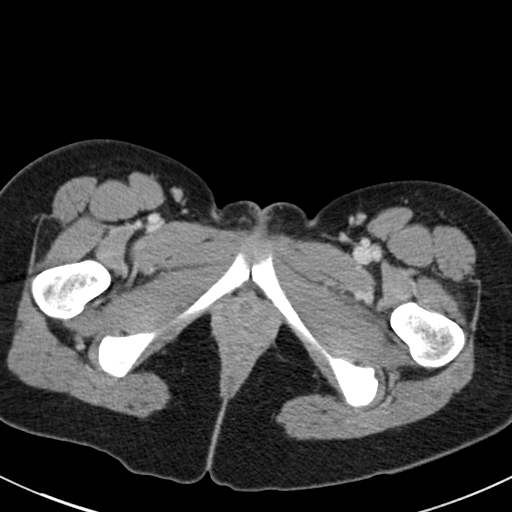
[im 5/86  bone]
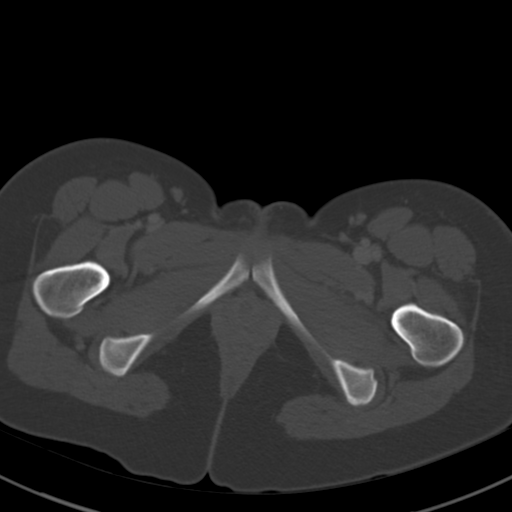
[im 13/86  soft-tissue]
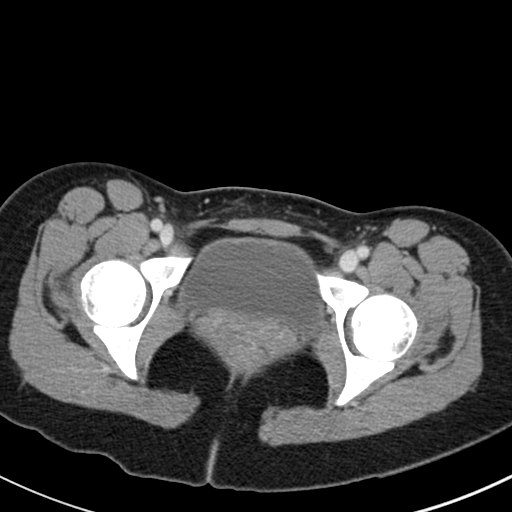
[im 18/86  soft-tissue]
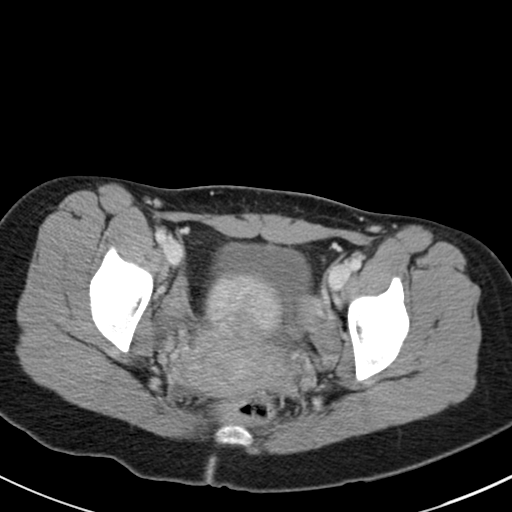
[im 26/86  soft-tissue]
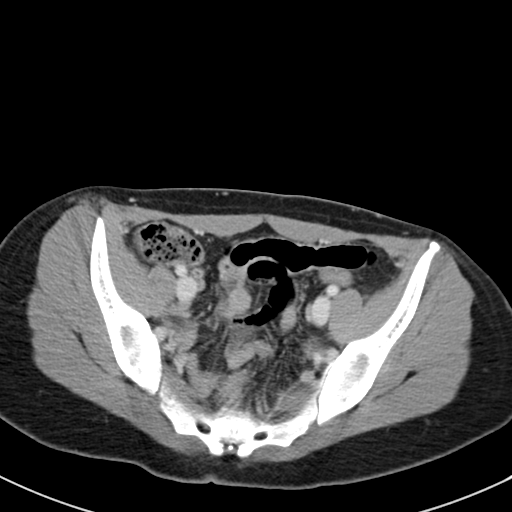
[im 35/86  soft-tissue]
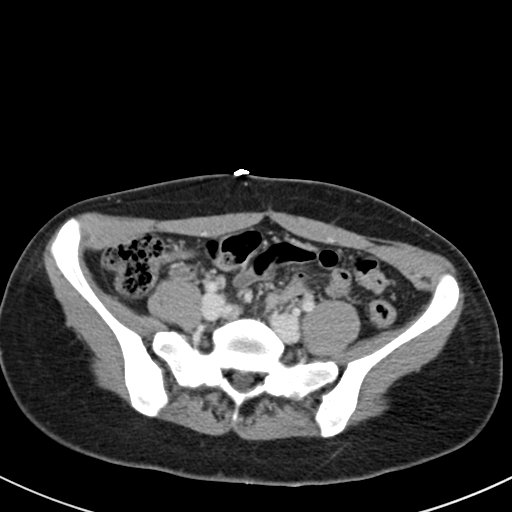
[im 39/86  soft-tissue]
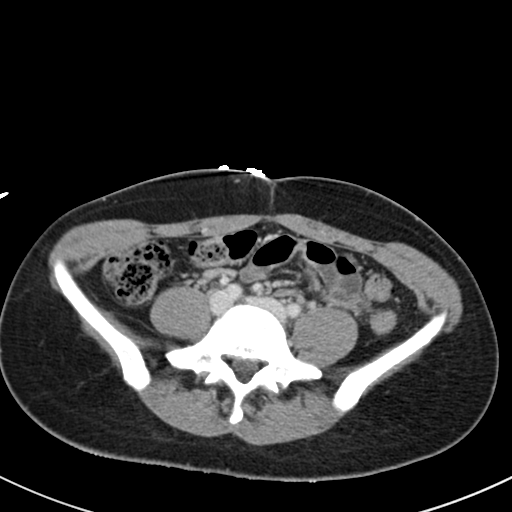
[im 47/86  soft-tissue]
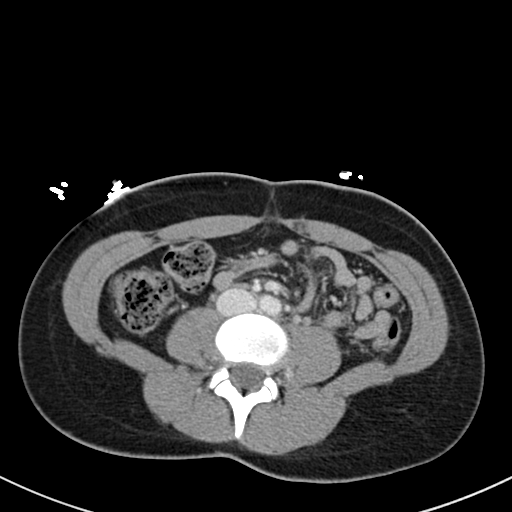
[im 52/86  soft-tissue]
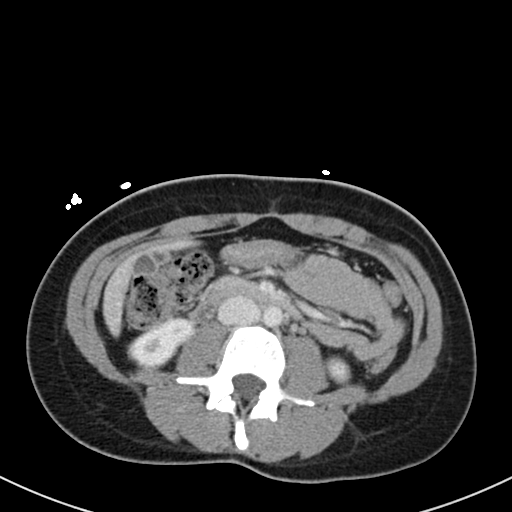
[im 60/86  soft-tissue]
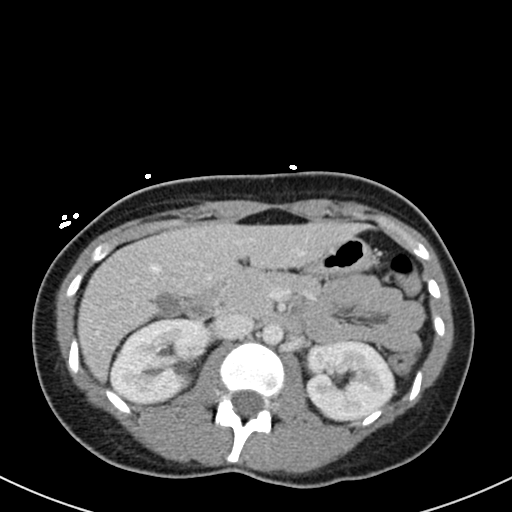
[im 60/86  bone]
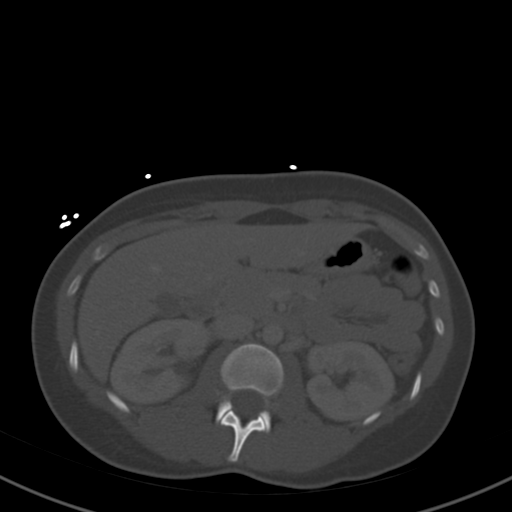
[im 69/86  soft-tissue]
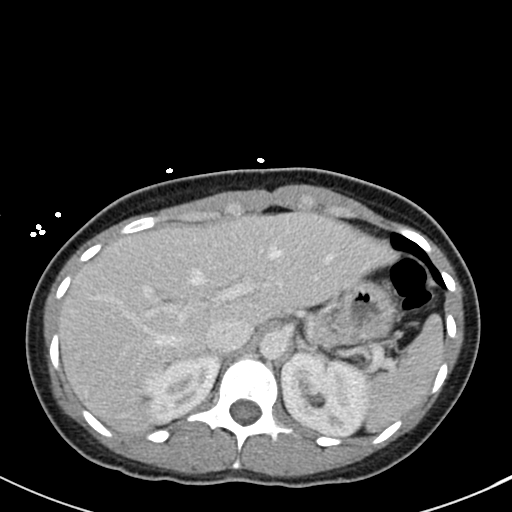
[im 73/86  soft-tissue]
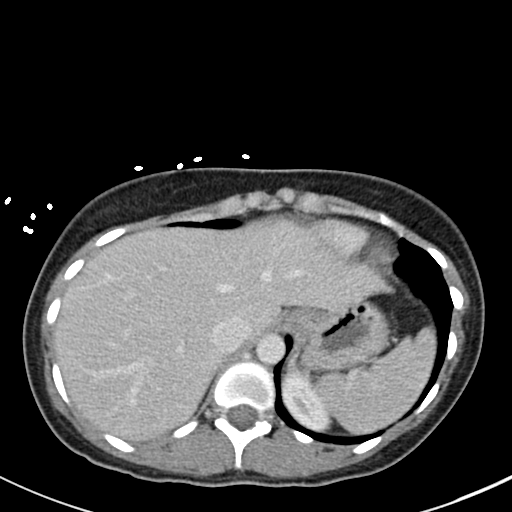
[im 81/86  soft-tissue]
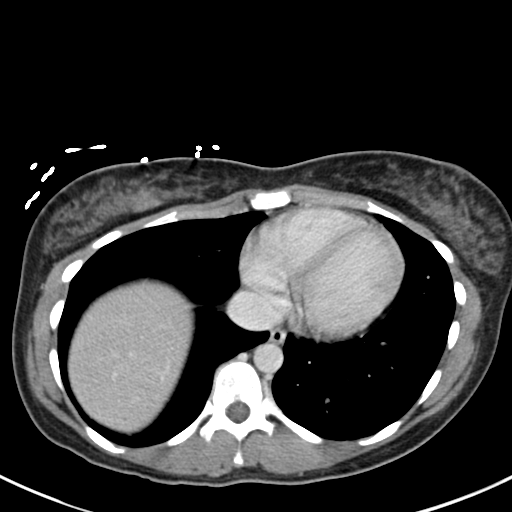

[Series 4: coronal st · coronal · 0.59mm/px · 3 of 63 slices shown]
[im 21/63  soft-tissue]
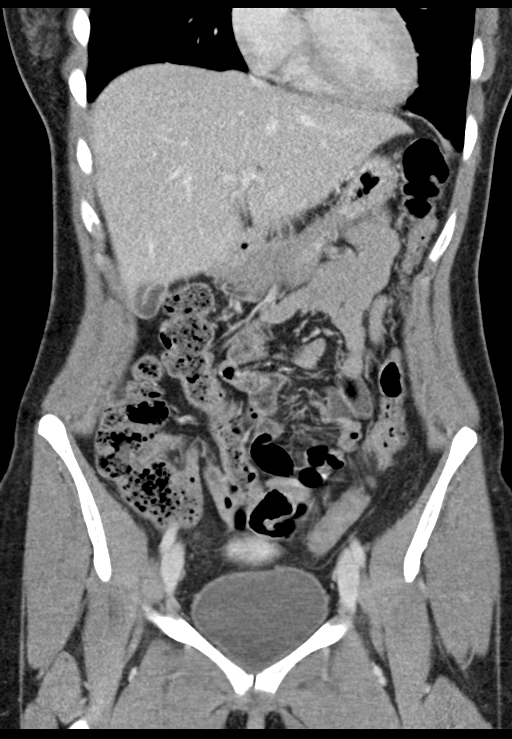
[im 28/63  soft-tissue]
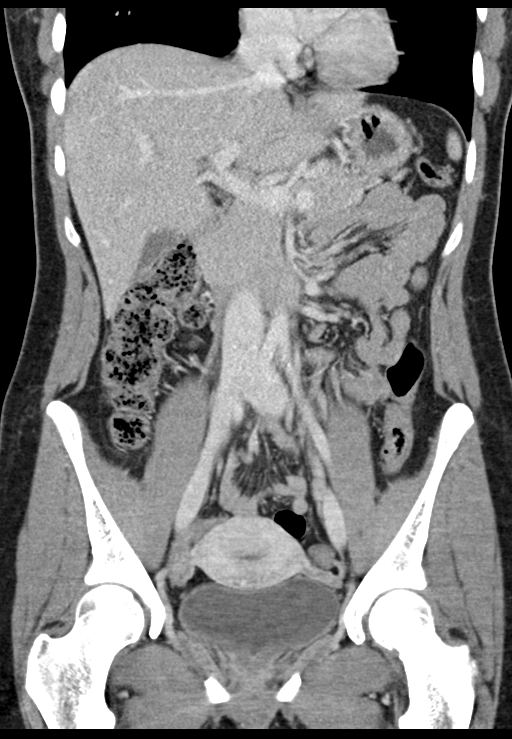
[im 35/63  soft-tissue]
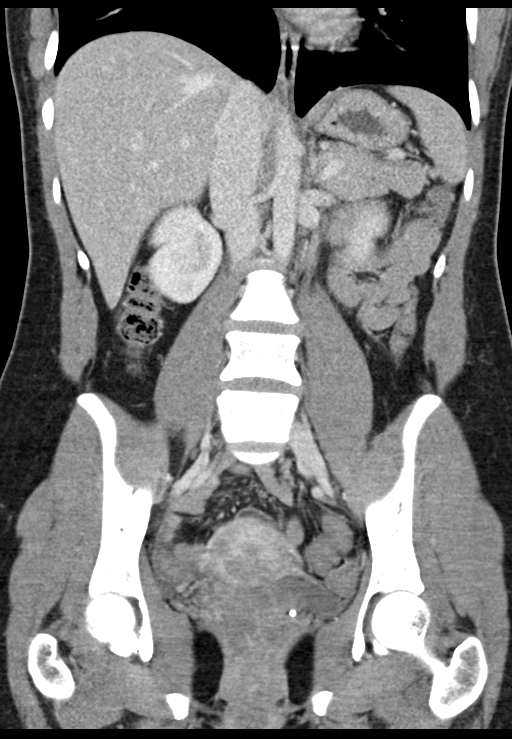

[15 of 46 positions shown; findings below may reference images not displayed]

FINDINGS: Lower chest: The visualized lung bases are grossly clear. The
visualized portions of the mediastinum are unremarkable.

Hepatobiliary: The liver is unremarkable in appearance. The
gallbladder is unremarkable in appearance. The common bile duct
remains normal in caliber.

Pancreas: The pancreas is within normal limits.

Spleen: The spleen is unremarkable in appearance.

Adrenals/Urinary Tract: The adrenal glands are unremarkable in
appearance. The kidneys are within normal limits. There is no
evidence of hydronephrosis. No renal or ureteral stones are
identified. No perinephric stranding is seen.

Stomach/Bowel: The stomach is unremarkable in appearance. The small
bowel is within normal limits. The appendix is normal in caliber,
without evidence of appendicitis. The colon is unremarkable in
appearance.

Vascular/Lymphatic: The abdominal aorta is unremarkable in
appearance. The inferior vena cava is grossly unremarkable. No
retroperitoneal lymphadenopathy is seen. No pelvic sidewall
lymphadenopathy is identified.

Reproductive: The bladder is mildly distended and grossly
unremarkable. The uterus is unremarkable in appearance. The ovaries
are relatively symmetric. No suspicious adnexal masses are seen.

Other: No additional soft tissue abnormalities are seen.

Musculoskeletal: No acute osseous abnormalities are identified. The
visualized musculature is unremarkable in appearance.
IMPRESSION: Unremarkable contrast-enhanced CT of the abdomen and pelvis.

## 2020-09-06 ENCOUNTER — Encounter: Payer: Self-pay | Admitting: *Deleted

## 2020-09-06 ENCOUNTER — Other Ambulatory Visit: Payer: Self-pay

## 2020-09-06 ENCOUNTER — Ambulatory Visit (INDEPENDENT_AMBULATORY_CARE_PROVIDER_SITE_OTHER): Payer: BC Managed Care – PPO | Admitting: *Deleted

## 2020-09-06 VITALS — BP 116/83 | HR 86 | Ht 68.0 in | Wt 135.9 lb

## 2020-09-06 DIAGNOSIS — Z3042 Encounter for surveillance of injectable contraceptive: Secondary | ICD-10-CM | POA: Diagnosis not present

## 2020-09-06 MED ORDER — MEDROXYPROGESTERONE ACETATE 150 MG/ML IM SUSP
150.0000 mg | Freq: Once | INTRAMUSCULAR | Status: AC
  Administered 2020-09-06: 150 mg via INTRAMUSCULAR

## 2020-09-06 NOTE — Progress Notes (Signed)
Depo Provera 150 mg IM administered as scheduled @ 1533. Pt tolerated well. Next injection due 6/6-6/20. Last Pap 03/14/18 - negative. Annual Gyn exam is due after 06/08/21. Pt voiced understanding of information provided.

## 2020-09-07 NOTE — Progress Notes (Signed)
Patient seen and assessed by nursing staff.  Agree with documentation and plan.  

## 2020-11-22 ENCOUNTER — Other Ambulatory Visit: Payer: Self-pay

## 2020-11-22 ENCOUNTER — Ambulatory Visit (INDEPENDENT_AMBULATORY_CARE_PROVIDER_SITE_OTHER): Payer: BC Managed Care – PPO

## 2020-11-22 VITALS — BP 117/84 | HR 92 | Ht 68.0 in | Wt 127.9 lb

## 2020-11-22 DIAGNOSIS — Z3042 Encounter for surveillance of injectable contraceptive: Secondary | ICD-10-CM

## 2020-11-22 MED ORDER — MEDROXYPROGESTERONE ACETATE 150 MG/ML IM SUSP
150.0000 mg | Freq: Once | INTRAMUSCULAR | Status: AC
  Administered 2020-11-22: 150 mg via INTRAMUSCULAR

## 2020-11-22 NOTE — Progress Notes (Signed)
Chart reviewed for nurse visit. Agree with plan of care.   Marny Lowenstein, PA-C 11/22/2020 2:49 PM

## 2020-11-22 NOTE — Progress Notes (Signed)
Traci Campbell here for Depo-Provera Injection. Injection administered without complication. Patient will return in 3 months for next injection between August 22 and Sept 5, 2022 Next annual visit due December 2022.[name]   Isabell Jarvis, RN 11/22/2020  2:02 PM

## 2021-02-10 ENCOUNTER — Ambulatory Visit (INDEPENDENT_AMBULATORY_CARE_PROVIDER_SITE_OTHER): Payer: BC Managed Care – PPO

## 2021-02-10 ENCOUNTER — Other Ambulatory Visit: Payer: Self-pay

## 2021-02-10 VITALS — BP 110/70 | HR 75 | Ht 68.0 in | Wt 129.0 lb

## 2021-02-10 DIAGNOSIS — Z3042 Encounter for surveillance of injectable contraceptive: Secondary | ICD-10-CM

## 2021-02-10 MED ORDER — MEDROXYPROGESTERONE ACETATE 150 MG/ML IM SUSP
150.0000 mg | Freq: Once | INTRAMUSCULAR | Status: AC
  Administered 2021-02-10: 150 mg via INTRAMUSCULAR

## 2021-02-10 NOTE — Progress Notes (Signed)
Bess Harvest Faries here for Depo-Provera Injection. Injection administered without complication. Patient will return in 3 months for next injection between Nov 10 and Nov 24,2022. Next annual visit due Dec 2022.   Isabell Jarvis, RN 02/10/2021  2:33 PM

## 2021-02-11 NOTE — Progress Notes (Signed)
Attestation of Attending Supervision of clinical support staff: I agree with the care provided to this patient and was available for any consultation.  I have reviewed the RN's note and chart. I was available for consult and to see the patient if needed.   Tyheim Vanalstyne Niles Eastyn Skalla, MD, MPH, ABFM Attending Physician Faculty Practice- [facility]  

## 2021-04-28 ENCOUNTER — Ambulatory Visit: Payer: BC Managed Care – PPO

## 2021-11-23 ENCOUNTER — Telehealth: Payer: Medicaid Other | Admitting: Physician Assistant

## 2021-11-23 DIAGNOSIS — U071 COVID-19: Secondary | ICD-10-CM

## 2021-11-23 MED ORDER — BENZONATATE 100 MG PO CAPS: 100.0000 mg | ORAL_CAPSULE | Freq: Three times a day (TID) | ORAL | 0 refills | Status: DC | PRN

## 2021-11-23 NOTE — Progress Notes (Signed)

## 2021-11-23 NOTE — Progress Notes (Signed)
I have spent 5 minutes in review of e-visit questionnaire, review and updating patient chart, medical decision making and response to patient.   Naisha Wisdom Cody Marionette Meskill, PA-C    

## 2022-05-16 ENCOUNTER — Telehealth: Payer: BC Managed Care – PPO

## 2022-06-30 ENCOUNTER — Telehealth: Payer: BC Managed Care – PPO | Admitting: Nurse Practitioner

## 2022-06-30 DIAGNOSIS — K047 Periapical abscess without sinus: Secondary | ICD-10-CM

## 2022-06-30 MED ORDER — PENICILLIN V POTASSIUM 500 MG PO TABS: 500.0000 mg | ORAL_TABLET | Freq: Three times a day (TID) | ORAL | 0 refills | Status: AC

## 2022-06-30 NOTE — Progress Notes (Signed)
E-Visit for Dental Pain  We are sorry that you are not feeling well.  Here is how we plan to help!  Based on what you have shared with me in the questionnaire, it sounds like you have an infection under your tooth  Pen VK 500mg  3 times a day for 7 days  It is imperative that you see a dentist within 10 days of this eVisit to determine the cause of the dental pain and be sure it is adequately treated  A toothache or tooth pain is caused when the nerve in the root of a tooth or surrounding a tooth is irritated. Dental (tooth) infection, decay, injury, or loss of a tooth are the most common causes of dental pain. Pain may also occur after an extraction (tooth is pulled out). Pain sometimes originates from other areas and radiates to the jaw, thus appearing to be tooth pain.Bacteria growing inside your mouth can contribute to gum disease and dental decay, both of which can cause pain. A toothache occurs from inflammation of the central portion of the tooth called pulp. The pulp contains nerve endings that are very sensitive to pain. Inflammation to the pulp or pulpitis may be caused by dental cavities, trauma, and infection.    HOME CARE:   For toothaches: Over-the-counter pain medications such as acetaminophen or ibuprofen may be used. Take these as directed on the package while you arrange for a dental appointment. Avoid very cold or hot foods, because they may make the pain worse. You may get relief from biting on a cotton ball soaked in oil of cloves. You can get oil of cloves at most drug stores.  For jaw pain:  Aspirin may be helpful for problems in the joint of the jaw in adults. If pain happens every time you open your mouth widely, the temporomandibular joint (TMJ) may be the source of the pain. Yawning or taking a large bite of food may worsen the pain. An appointment with your doctor or dentist will help you find the cause.     GET HELP RIGHT AWAY IF:  You have a high fever or  chills If you have had a recent head or face injury and develop headache, light headedness, nausea, vomiting, or other symptoms that concern you after an injury to your face or mouth, you could have a more serious injury in addition to your dental injury. A facial rash associated with a toothache: This condition may improve with medication. Contact your doctor for them to decide what is appropriate. Any jaw pain occurring with chest pain: Although jaw pain is most commonly caused by dental disease, it is sometimes referred pain from other areas. People with heart disease, especially people who have had stents placed, people with diabetes, or those who have had heart surgery may have jaw pain as a symptom of heart attack or angina. If your jaw or tooth pain is associated with lightheadedness, sweating, or shortness of breath, you should see a doctor as soon as possible. Trouble swallowing or excessive pain or bleeding from gums: If you have a history of a weakened immune system, diabetes, or steroid use, you may be more susceptible to infections. Infections can often be more severe and extensive or caused by unusual organisms. Dental and gum infections in people with these conditions may require more aggressive treatment. An abscess may need draining or IV antibiotics, for example.  MAKE SURE YOU   Understand these instructions. Will watch your condition. Will get help right  away if you are not doing well or get worse.  Thank you for choosing an e-visit.  Your e-visit answers were reviewed by a board certified advanced clinical practitioner to complete your personal care plan. Depending upon the condition, your plan could have included both over the counter or prescription medications.  Please review your pharmacy choice. Make sure the pharmacy is open so you can pick up prescription now. If there is a problem, you may contact your provider through [employer] and have the prescription routed to  another pharmacy.  Your safety is important to [place]. If you have drug allergies check your prescription carefully.   For the next 24 hours you can use MyChart to ask questions about today's visit, request a non-urgent call back, or ask for a work or school excuse. You will get an email in the next two days asking about your experience. I hope that your e-visit has been valuable and will speed your recovery.   Meds ordered this encounter  Medications   penicillin v potassium (VEETID) 500 MG tablet    Sig: Take 1 tablet (500 mg total) by mouth 3 (three) times daily for 7 days.    Dispense:  21 tablet    Refill:  0    I spent approximately 5 minutes reviewing the patient's history, current symptoms and coordinating their care today.

## 2022-07-24 ENCOUNTER — Encounter (HOSPITAL_COMMUNITY): Payer: Self-pay

## 2022-07-24 ENCOUNTER — Other Ambulatory Visit: Payer: Self-pay

## 2022-07-24 ENCOUNTER — Emergency Department (HOSPITAL_COMMUNITY)
Admission: EM | Admit: 2022-07-24 | Discharge: 2022-07-25 | Disposition: A | Discharge status: Home / Self Care | Payer: Medicaid Other | Attending: Emergency Medicine | Admitting: Emergency Medicine

## 2022-07-24 DIAGNOSIS — R45851 Suicidal ideations: Secondary | ICD-10-CM | POA: Diagnosis not present

## 2022-07-24 DIAGNOSIS — F1010 Alcohol abuse, uncomplicated: Secondary | ICD-10-CM | POA: Insufficient documentation

## 2022-07-24 DIAGNOSIS — F1914 Other psychoactive substance abuse with psychoactive substance-induced mood disorder: Secondary | ICD-10-CM | POA: Diagnosis not present

## 2022-07-24 DIAGNOSIS — Z1152 Encounter for screening for COVID-19: Secondary | ICD-10-CM | POA: Diagnosis not present

## 2022-07-24 DIAGNOSIS — D72829 Elevated white blood cell count, unspecified: Secondary | ICD-10-CM | POA: Insufficient documentation

## 2022-07-24 DIAGNOSIS — F1410 Cocaine abuse, uncomplicated: Secondary | ICD-10-CM | POA: Insufficient documentation

## 2022-07-24 DIAGNOSIS — F1994 Other psychoactive substance use, unspecified with psychoactive substance-induced mood disorder: Secondary | ICD-10-CM | POA: Diagnosis not present

## 2022-07-24 HISTORY — DX: Alcohol abuse, uncomplicated: F10.10

## 2022-07-24 LAB — CBC WITH DIFFERENTIAL/PLATELET
Abs Immature Granulocytes: 0.04 10*3/uL (ref 0.00–0.07)
Basophils Absolute: 0 10*3/uL (ref 0.0–0.1)
Basophils Relative: 0 %
Eosinophils Absolute: 0 10*3/uL (ref 0.0–0.5)
Eosinophils Relative: 0 %
HCT: 37.7 % (ref 36.0–46.0)
Hemoglobin: 12.6 g/dL (ref 12.0–15.0)
Immature Granulocytes: 0 %
Lymphocytes Relative: 29 %
Lymphs Abs: 3.5 10*3/uL (ref 0.7–4.0)
MCH: 30 pg (ref 26.0–34.0)
MCHC: 33.4 g/dL (ref 30.0–36.0)
MCV: 89.8 fL (ref 80.0–100.0)
Monocytes Absolute: 0.8 10*3/uL (ref 0.1–1.0)
Monocytes Relative: 7 %
Neutro Abs: 7.5 10*3/uL (ref 1.7–7.7)
Neutrophils Relative %: 64 %
Platelets: 289 10*3/uL (ref 150–400)
RBC: 4.2 MIL/uL (ref 3.87–5.11)
RDW: 12.7 % (ref 11.5–15.5)
WBC: 11.9 10*3/uL — ABNORMAL HIGH (ref 4.0–10.5)
nRBC: 0 % (ref 0.0–0.2)

## 2022-07-24 LAB — URINALYSIS, ROUTINE W REFLEX MICROSCOPIC
Bilirubin Urine: NEGATIVE
Glucose, UA: NEGATIVE mg/dL
Hgb urine dipstick: NEGATIVE
Ketones, ur: NEGATIVE mg/dL
Nitrite: NEGATIVE
Protein, ur: NEGATIVE mg/dL
Specific Gravity, Urine: 1.016 (ref 1.005–1.030)
pH: 6 (ref 5.0–8.0)

## 2022-07-24 LAB — COMPREHENSIVE METABOLIC PANEL
ALT: 13 U/L (ref 0–44)
AST: 23 U/L (ref 15–41)
Albumin: 4.3 g/dL (ref 3.5–5.0)
Alkaline Phosphatase: 57 U/L (ref 38–126)
Anion gap: 13 (ref 5–15)
BUN: 9 mg/dL (ref 6–20)
CO2: 21 mmol/L — ABNORMAL LOW (ref 22–32)
Calcium: 9 mg/dL (ref 8.9–10.3)
Chloride: 104 mmol/L (ref 98–111)
Creatinine, Ser: 0.59 mg/dL (ref 0.44–1.00)
GFR, Estimated: >60 mL/min (ref >60)
Glucose, Bld: 99 mg/dL (ref 70–99)
Potassium: 3.8 mmol/L (ref 3.5–5.1)
Sodium: 138 mmol/L (ref 135–145)
Total Bilirubin: 0.5 mg/dL (ref 0.3–1.2)
Total Protein: 7.6 g/dL (ref 6.5–8.1)

## 2022-07-24 LAB — ETHANOL: Alcohol, Ethyl (B): 113 mg/dL — ABNORMAL HIGH (ref <10)

## 2022-07-24 LAB — SALICYLATE LEVEL: Salicylate Lvl: <7 mg/dL — ABNORMAL LOW (ref 7.0–30.0)

## 2022-07-24 LAB — RAPID URINE DRUG SCREEN, HOSP PERFORMED
Amphetamines: NOT DETECTED
Barbiturates: NOT DETECTED
Benzodiazepines: NOT DETECTED
Cocaine: POSITIVE — AB
Opiates: NOT DETECTED
Tetrahydrocannabinol: NOT DETECTED

## 2022-07-24 LAB — RESP PANEL BY RT-PCR (RSV, FLU A&B, COVID)  RVPGX2
Influenza A by PCR: NEGATIVE
Influenza B by PCR: NEGATIVE
Resp Syncytial Virus by PCR: NEGATIVE
SARS Coronavirus 2 by RT PCR: NEGATIVE

## 2022-07-24 LAB — ACETAMINOPHEN LEVEL: Acetaminophen (Tylenol), Serum: <10 ug/mL — ABNORMAL LOW (ref 10–30)

## 2022-07-24 LAB — I-STAT BETA HCG BLOOD, ED (MC, WL, AP ONLY): I-stat hCG, quantitative: <5 m[IU]/mL (ref <5)

## 2022-07-24 MED ORDER — HYDROXYZINE HCL 25 MG PO TABS
25.0000 mg | ORAL_TABLET | Freq: Three times a day (TID) | ORAL | Status: DC | PRN
  Administered 2022-07-24: 25 mg via ORAL
  Filled 2022-07-24: qty 1

## 2022-07-24 MED ORDER — HYDROXYZINE HCL 25 MG PO TABS: 25.0000 mg | ORAL_TABLET | Freq: Three times a day (TID) | ORAL | 0 refills | Status: AC | PRN

## 2022-07-24 MED ORDER — ONDANSETRON 4 MG PO TBDP
4.0000 mg | ORAL_TABLET | Freq: Once | ORAL | Status: AC
  Administered 2022-07-24: 4 mg via ORAL
  Filled 2022-07-24: qty 1

## 2022-07-24 NOTE — Consult Note (Signed)
BH ED ASSESSMENT   Reason for Consult:  Eval Referring Physician:  Montel Clock, [place] Patient Identification: Traci [place] MRN:  [id number] ED Chief Complaint: Substance induced mood disorder (HCC)  Diagnosis:  Principal Problem:   Substance induced mood disorder (HCC) Active Problems:   Cocaine abuse (HCC)   Alcohol abuse   ED Assessment Time Calculation: Start Time: 1800 Stop Time: 1840 Total Time in Minutes (Assessment Completion): 40   HPI:   Traci [place] is a 27 y.o. female patient with reported history of depression, anxiety, cocaine abuse, and alcohol use presents to [facility] after calling mobile crisis. Pt began feeling thoughts of "not caring what happens to her" which frightened her. Pt is requesting help for substance abuse as well.   Subjective:   Patient seen at [facility] for face to face evaluation. Pt is tearful during assessment, and tells me she has been diagnosed with depression and anxiety before. She previously took Lexapro, but has been around 1-2 years since she last took medication. For the past 2 years, patient has been struggling with cocaine and alcohol abuse. She reports cocaine use almost daily. She also reports frequent alcohol use, multiple times per week/sometimes daily. She reports binge drinking when she does drink, and is unsure of how many drinks she consumes. She denies any history of withdrawal seizure or DT. She denies previous rehab/detox stays. Denies any previous inpatient psychiatric hospitalizations.   She called mobile crisis today because she got to the point where she "doesn't care what happens to her." She stated she has never felt that way before, and when suicidal thoughts started happening it frightened her and she called for help. She also noted she was intoxicated upon having these feelings. Currently, she is denying SI, no plan or intent. No previous suicide attempts. She denies HI. Denies AVH. She stated she is very motivated to become sober, she  recently lost her job due to missing to many days of work due to substance abuse. She is hoping to start intensive outpatient substance use treatment. I offered her inpatient psychiatric treatment and explained the benefits. She requested to think about it and I gave her around an hour. Ultimately when I came back to speak with her, she is requesting to discharge home. She is not currently in acute distress or crisis. She is denying SI/HI/AVH. No suicidal history. She has plans to follow up with resources tomorrow, and stated she will present to [facility] if she feels like she is in crisis gain. She mentions her family being very supportive, they know she is in the hospital right now. She is able to contract for safety, and does not meet Solomon IVC criteria.   Offered to restart her Lexapro, however patient declined wanting to start scheduled medications at this time. Patient expressed having a lot of anxiety currently, added hydroxyzine 25 mg PRN TID. She did take a dose prior to our second conversation, and reported the hydroxyzine did help ease her anxiety. Will provide 7 days worth of this as she requested.   Past Psychiatric History:  Depression, anxiety, polysubstance abuse  Risk to Self or Others: Is the patient at risk to self? No Has the patient been a risk to self in the past 6 months? No Has the patient been a risk to self within the distant past? No Is the patient a risk to others? No Has the patient been a risk to others in the past 6 months? No Has the patient been a risk  to others within the distant past? No  [place] Scale:  [place] ED from 07/24/2022 in [facility] Emergency Department at [place] No Risk       Past Medical History: History reviewed. No pertinent past medical history. History reviewed. No pertinent surgical history. Family History:  Family History  Problem Relation Age of Onset   Hypertension Father    Hypertension Mother     Social History:  Social History   Substance and Sexual Activity  Alcohol Use Yes   Comment: 3 times a week     Social History   Substance and Sexual Activity  Drug Use No    Social History   Socioeconomic History   Marital status: Single    Spouse name: Not on file   Number of children: Not on file   Years of education: Not on file   Highest education level: Not on file  Occupational History   Not on file  Tobacco Use   Smoking status: Never   Smokeless tobacco: Never  Vaping Use   Vaping Use: Never used  Substance and Sexual Activity   Alcohol use: Yes    Comment: 3 times a week   Drug use: No   Sexual activity: Yes    Birth control/protection: Patch  Other Topics Concern   Not on file  Social History Narrative   Not on file   Social Determinants of Health   Financial Resource Strain: Not on file  Food Insecurity: No Food Insecurity (06/08/2020)   Hunger Vital Sign    Worried About Running Out of Food in the Last Year: Never true    Ran Out of Food in the Last Year: Never true  Transportation Needs: No Transportation Needs (06/08/2020)   PRAPARE - Hydrologist (Medical): No    Lack of Transportation (Non-Medical): No  Physical Activity: Not on file  Stress: Not on file  Social Connections: Not on file   Additional Social History:    Allergies:  No Known Allergies  Labs:  Results for orders placed or performed during the hospital encounter of 07/24/22 (from the past 48 hour(s))  Comprehensive metabolic panel     Status: Abnormal   Collection Time: 07/24/22  1:53 PM  Result Value Ref Range   Sodium 138 135 - 145 mmol/L   Potassium 3.8 3.5 - 5.1 mmol/L   Chloride 104 98 - 111 mmol/L   CO2 21 (L) 22 - 32 mmol/L   Glucose, Bld 99 70 - 99 mg/dL    Comment: Glucose reference range applies only to samples taken after fasting for at least 8 hours.   BUN 9 6 - 20 mg/dL   Creatinine, Ser 0.59 0.44 - 1.00 mg/dL   Calcium 9.0  8.9 - 10.3 mg/dL   Total Protein 7.6 6.5 - 8.1 g/dL   Albumin 4.3 3.5 - 5.0 g/dL   AST 23 15 - 41 U/L   ALT 13 0 - 44 U/L   Alkaline Phosphatase 57 38 - 126 U/L   Total Bilirubin 0.5 0.3 - 1.2 mg/dL   GFR, Estimated >60 >60 mL/min    Comment: (NOTE) Calculated using the CKD-EPI Creatinine Equation (2021)    Anion gap 13 5 - 15    Comment: Performed at [facility] [address] [place], [place] [address]  Urine rapid drug screen (hosp performed)     Status: Abnormal   Collection Time: 07/24/22  1:53 PM  Result Value Ref Range   Opiates NONE DETECTED NONE DETECTED   Cocaine POSITIVE (A) NONE DETECTED   Benzodiazepines NONE DETECTED NONE DETECTED   Amphetamines NONE DETECTED NONE DETECTED   Tetrahydrocannabinol NONE DETECTED NONE DETECTED   Barbiturates NONE DETECTED NONE DETECTED    Comment: (NOTE) DRUG SCREEN FOR MEDICAL PURPOSES ONLY.  IF CONFIRMATION IS NEEDED FOR ANY PURPOSE, NOTIFY LAB WITHIN 5 DAYS.  LOWEST DETECTABLE LIMITS FOR URINE DRUG SCREEN Drug Class                     Cutoff (ng/mL) Amphetamine and metabolites    1000 Barbiturate and metabolites    200 Benzodiazepine                 200 Opiates and metabolites        300 Cocaine and metabolites        300 THC                            50 Performed at [facility], [address] [place], [place] [address]   CBC with Diff     Status: Abnormal   Collection Time: 07/24/22  1:53 PM  Result Value Ref Range   WBC 11.9 (H) 4.0 - 10.5 K/uL   RBC 4.20 3.87 - 5.11 MIL/uL   Hemoglobin 12.6 12.0 - 15.0 g/dL   HCT 19.1 47.8 - 29.5 %   MCV 89.8 80.0 - 100.0 fL   MCH 30.0 26.0 - 34.0 pg   MCHC 33.4 30.0 - 36.0 g/dL   RDW 62.1 30.8 - 65.7 %   Platelets 289 150 - 400 K/uL   nRBC 0.0 0.0 - 0.2 %   Neutrophils Relative % 64 %   Neutro Abs 7.5 1.7 - 7.7 K/uL   Lymphocytes Relative 29 %   Lymphs Abs 3.5 0.7 - 4.0 K/uL   Monocytes Relative 7 %   Monocytes Absolute 0.8 0.1 - 1.0 K/uL   Eosinophils  Relative 0 %   Eosinophils Absolute 0.0 0.0 - 0.5 K/uL   Basophils Relative 0 %   Basophils Absolute 0.0 0.0 - 0.1 K/uL   Immature Granulocytes 0 %   Abs Immature Granulocytes 0.04 0.00 - 0.07 K/uL    Comment: Performed at [facility], [address], [place], [place] [address]  Ethanol     Status: Abnormal   Collection Time: 07/24/22  1:54 PM  Result Value Ref Range   Alcohol, Ethyl (B) 113 (H) <10 mg/dL    Comment: (NOTE) Lowest detectable limit for serum alcohol is 10 mg/dL.  For medical purposes only. Performed at [facility], [address] [place], [place] [address]   Salicylate level     Status: Abnormal   Collection Time: 07/24/22  1:54 PM  Result Value Ref Range   Salicylate Lvl <7.0 (L) 7.0 - 30.0 mg/dL    Comment: Performed at [facility], [address] [place], [place] [address]  Acetaminophen level     Status: Abnormal   Collection Time: 07/24/22  1:54 PM  Result Value Ref Range   Acetaminophen (Tylenol), Serum <10 (L) 10 - 30 ug/mL    Comment: (NOTE) Therapeutic concentrations vary significantly. A range of 10-30 ug/mL  may be an effective concentration for many patients. However, some  are best treated at concentrations outside of this range. Acetaminophen concentrations >150 ug/mL  at 4 hours after ingestion  and >50 ug/mL at 12 hours after ingestion are often associated with  toxic reactions.  Performed at [facility], [place] [address] [place], [place] [address]   Urinalysis, Routine w reflex microscopic -Urine, Clean Catch     Status: Abnormal   Collection Time: 07/24/22  1:54 PM  Result Value Ref Range   Color, Urine YELLOW YELLOW   APPearance HAZY (A) CLEAR   Specific Gravity, Urine 1.016 1.005 - 1.030   pH 6.0 5.0 - 8.0   Glucose, UA NEGATIVE NEGATIVE mg/dL   Hgb urine dipstick NEGATIVE NEGATIVE   Bilirubin Urine NEGATIVE NEGATIVE   Ketones, ur NEGATIVE NEGATIVE mg/dL   Protein, ur NEGATIVE NEGATIVE mg/dL   Nitrite  NEGATIVE NEGATIVE   Leukocytes,Ua SMALL (A) NEGATIVE   RBC / HPF 0-5 0 - 5 RBC/hpf   WBC, UA 0-5 0 - 5 WBC/hpf   Bacteria, UA RARE (A) NONE SEEN   Squamous Epithelial / HPF 6-10 0 - 5 /HPF   Mucus PRESENT     Comment: Performed at [facility], [place] [address], [place], [place] [address]  I-Stat beta hCG blood, ED     Status: None   Collection Time: 07/24/22  2:36 PM  Result Value Ref Range   I-stat hCG, quantitative <5.0 <5 mIU/mL   Comment 3            Comment:   GEST. AGE      CONC.  (mIU/mL)   <=1 WEEK        5 - 50     2 WEEKS       50 - 500     3 WEEKS       100 - 10,000     4 WEEKS     1,000 - 30,000        FEMALE AND NON-PREGNANT FEMALE:     LESS THAN 5 mIU/mL     Current Facility-Administered Medications  Medication Dose Route Frequency Provider Last Rate Last Admin   hydrOXYzine (ATARAX) tablet 25 mg  25 mg Oral TID PRN Vesta Mixer, NP   25 mg at 07/24/22 1724   Current Outpatient Medications  Medication Sig Dispense Refill   benzonatate (TESSALON) 100 MG capsule Take 1 capsule (100 mg total) by mouth 3 (three) times daily as needed for cough. 30 capsule 0   cyclobenzaprine (FLEXERIL) 10 MG tablet Take 10 mg by mouth 2 (two) times daily as needed.     escitalopram (LEXAPRO) 20 MG tablet Take 20 mg by mouth daily.     hydrOXYzine (ATARAX/VISTARIL) 25 MG tablet Take 25 mg by mouth daily.     levocetirizine (XYZAL) 5 MG tablet Take 5 mg by mouth daily.     Psychiatric Specialty Exam: Presentation  General Appearance:  Appropriate for Environment  Eye Contact: Good  Speech: Clear and Coherent  Speech Volume: Normal  Handedness:No data recorded  Mood and Affect  Mood: Anxious; Depressed  Affect: Tearful; Congruent   Thought Process  Thought Processes: Coherent  Descriptions of Associations:Intact  Orientation:Full (Time, Place and Person)  Thought Content:Logical  History of Schizophrenia/Schizoaffective disorder:No data  recorded Duration of Psychotic Symptoms:No data recorded Hallucinations:Hallucinations: None  Ideas of Reference:None  Suicidal Thoughts:Suicidal Thoughts: Yes, Passive  Homicidal Thoughts:Homicidal Thoughts: No   Sensorium  Memory: Immediate Good; Recent Good  Judgment: Fair  Insight: Fair   Executive Functions  Concentration: Good  Attention Span: Good  Recall: Good  Fund of Knowledge: Good  Language: Good   Psychomotor Activity  Psychomotor Activity: Psychomotor Activity: Normal   Assets  Assets: Communication Skills; Leisure Time; Physical Health; Desire for Improvement; Resilience; Social Support; Housing    Sleep  Sleep: Sleep: Fair   Physical Exam: Physical Exam Neurological:     Mental Status: She is alert and oriented to person, place, and time.  Psychiatric:        Attention and Perception: Attention normal.        Mood and Affect: Mood is anxious. Affect is tearful.        Speech: Speech normal.        Behavior: Behavior is cooperative.        Thought Content: Thought content normal.    Review of Systems  Psychiatric/Behavioral:  Positive for depression and substance abuse. The patient is nervous/anxious.   All other systems reviewed and are negative.  Blood pressure (!) 125/93, pulse (!) 113, temperature 98.8 F (37.1 C), temperature source Oral, resp. rate 13, height 5\' 8"  (1.727 m), weight 72.6 kg, SpO2 97 %. Body mass index is 24.33 kg/m.  Medical Decision Making: Pt case reviewed and discussed with Dr. Dwyane Dee. Pt is able to contract for safety at this time, denies SI/HI/AVH. She is understanding of substance abuse resources, and plans to follow up tomorrow. Also understanding of return precautions, and other mental health resources such as [place]. Pt is requesting to discharge home. Will psychiatrically clear patient.   - resources provided in AVS for substance abuse treatment, therapy, and OP follow up  Disposition: Psych  cleared  Vesta Mixer, NP 07/24/2022 5:41 PM

## 2022-07-24 NOTE — Discharge Instructions (Signed)

## 2022-07-24 NOTE — ED Notes (Signed)
Pt belongings bag x1 moved to locker #3 in purple zone

## 2022-07-24 NOTE — ED Provider Triage Note (Signed)
Emergency Medicine Provider Triage Evaluation Note  Traci [place] , a 27 y.o. female  was evaluated in triage.  Pt complains of thoughts of anxiety and depression for the last 2 weeks since she relapsed and started using cocaine and alcohol again.  Patient states that she lost her job in mid December and thus has been increasingly depressed since that time as this not only preoccupied her to keep her away from substances but was her only means of financial support.  She was coping with this by seeing a therapist throughout January, however, her benefits have now ended and she is no longer able to see this therapist.  She states that she called the mobile crisis unit this morning and they came out to her house and recommended that she come to the ED for further evaluation.  She states that she is "indifferent" to being alive and does not "care what happens to me."  She states that she last used cocaine and alcohol this morning and uses approximately every other day.  Unable to identify how much alcohol she consumes.  States that approximately 1 year ago she was on Lexapro prescribed by her PCP but did not notice any improvement in her anxiety and depression on this medication so she discontinued it.  She denies any homicidal ideation, auditory or visual hallucinations, or plan for suicide or self-harm at this time.  States that she is willing to stay to have medical clearance evaluation and be evaluated by psychiatric team to receive help for her mental health.  Review of Systems  Positive: See HPI Negative: See HPI  Physical Exam  BP (!) 125/93   Pulse (!) 113   Temp 98.8 F (37.1 C) (Oral)   Resp 13   Ht 5\' 8"  (1.727 m)   Wt 72.6 kg   SpO2 97%   BMI 24.33 kg/m  Gen:   Awake, no distress   Resp:  Normal effort  MSK:   Moves extremities without difficulty  Other:  Anxious, very tearful, expresses indifference toward life consistent with passive suicidal ideation but no intent or plan, no HI, no  AVH, does not appear to be responding to internal stimuli, cooperative  Medical Decision Making  Medically screening exam initiated at 1:54 PM.  Appropriate orders placed.  Donica Derouin Loper was informed that the remainder of the evaluation will be completed by another provider, this initial triage assessment does not replace that evaluation, and the importance of remaining in the ED until their evaluation is complete.     Suzzette Righter, PA-C 07/24/22 1358

## 2022-07-24 NOTE — ED Provider Notes (Signed)
[facility] Provider Note   CSN: [id number] Arrival date & time: 07/24/22  1312     History  Chief Complaint  Patient presents with   Psychiatric Evaluation    Traci Campbell is a 27 y.o. female with past medical history significant for anxiety, depression, substance abuse who presents with concern for alcohol cocaine relapse with some occasional passive suicidal ideation.  Patient denies active SI, SI with plan, history of previous suicide attempt.  Patient reports that she had been sober but recently started relapsing on cocaine, and binge drinking alcohol.  Patient reports last alcohol use was last night overnight into this morning.  She reports that she is not feeling particularly intoxicated anymore but is feeling nauseous still.  HPI     Home Medications Prior to Admission medications   Medication Sig Start Date End Date Taking? Authorizing Provider  benzonatate (TESSALON) 100 MG capsule Take 1 capsule (100 mg total) by mouth 3 (three) times daily as needed for cough. 11/23/21   Brunetta Jeans, PA-C  cyclobenzaprine (FLEXERIL) 10 MG tablet Take 10 mg by mouth 2 (two) times daily as needed. 01/17/21   [provider]  escitalopram (LEXAPRO) 20 MG tablet Take 20 mg by mouth daily. 01/26/21   [provider]  hydrOXYzine (ATARAX) 25 MG tablet Take 1 tablet (25 mg total) by mouth 3 (three) times daily as needed for up to 7 days for anxiety. 07/24/22 07/31/22  Vesta Mixer, NP  levocetirizine (XYZAL) 5 MG tablet Take 5 mg by mouth daily. 11/02/20   [provider]      Allergies    Patient has no known allergies.    Review of Systems   Review of Systems  All other systems reviewed and are negative.   Physical Exam Updated Vital Signs BP 113/84   Pulse (!) 103   Temp 98.3 F (36.8 C) (Oral)   Resp 16   Ht 5\' 8"  (1.727 m)   Wt 72.6 kg   SpO2 100%   BMI 24.33 kg/m  Physical Exam Vitals and nursing  note reviewed.  Constitutional:      General: She is not in acute distress.    Appearance: Normal appearance.     Comments: Despite alcohol level, patient not significantly intoxicated on physical exam, answers questions appropriately  HENT:     Head: Normocephalic and atraumatic.  Eyes:     General:        Right eye: No discharge.        Left eye: No discharge.  Cardiovascular:     Rate and Rhythm: Normal rate and regular rhythm.     Heart sounds: No murmur heard.    No friction rub. No gallop.  Pulmonary:     Effort: Pulmonary effort is normal.     Breath sounds: Normal breath sounds.  Abdominal:     General: Bowel sounds are normal.     Palpations: Abdomen is soft.  Skin:    General: Skin is warm and dry.     Capillary Refill: Capillary refill takes less than 2 seconds.  Neurological:     Mental Status: She is alert and oriented to person, place, and time.  Psychiatric:        Mood and Affect: Mood normal.        Behavior: Behavior normal.     Comments: Patient somewhat anxious on exam, she endorses some passive SI but denies any active SI with plan,  no previous suicide attempts per patient, she answers questions appropriately with no flight of ideas, or tangential thoughts, she does not seem to be responding to internal stimuli.  She denies any HI, AVH.     ED Results / Procedures / Treatments   Labs (all labs ordered are listed, but only abnormal results are displayed) Labs Reviewed  COMPREHENSIVE METABOLIC PANEL - Abnormal; Notable for the following components:      Result Value   CO2 21 (*)    All other components within normal limits  ETHANOL - Abnormal; Notable for the following components:   Alcohol, Ethyl (B) 113 (*)    All other components within normal limits  RAPID URINE DRUG SCREEN, HOSP PERFORMED - Abnormal; Notable for the following components:   Cocaine POSITIVE (*)    All other components within normal limits  CBC WITH DIFFERENTIAL/PLATELET -  Abnormal; Notable for the following components:   WBC 11.9 (*)    All other components within normal limits  SALICYLATE LEVEL - Abnormal; Notable for the following components:   Salicylate Lvl <7.3 (*)    All other components within normal limits  ACETAMINOPHEN LEVEL - Abnormal; Notable for the following components:   Acetaminophen (Tylenol), Serum <10 (*)    All other components within normal limits  URINALYSIS, ROUTINE W REFLEX MICROSCOPIC - Abnormal; Notable for the following components:   APPearance HAZY (*)    Leukocytes,Ua SMALL (*)    Bacteria, UA RARE (*)    All other components within normal limits  RESP PANEL BY RT-PCR (RSV, FLU A&B, COVID)  RVPGX2  I-STAT BETA HCG BLOOD, ED ([facility], [facility], [facility] ONLY)    EKG EKG Interpretation  Date/Time:  Monday July 24 2022 13:44:58 EST Ventricular Rate:  105 PR Interval:  140 QRS Duration: 62 QT Interval:  328 QTC Calculation: 433 R Axis:   86 Text Interpretation: Sinus tachycardia Nonspecific T wave abnormality Abnormal ECG No previous ECGs available No acute changes Confirmed by Varney Biles ([id number]) on 07/24/2022 2:38:37 PM  Radiology No results found.  Procedures Procedures    Medications Ordered in ED Medications  hydrOXYzine (ATARAX) tablet 25 mg (25 mg Oral Given 07/24/22 1724)  ondansetron (ZOFRAN-ODT) disintegrating tablet 4 mg (4 mg Oral Given 07/24/22 1558)    ED Course/ Medical Decision Making/ A&P Clinical Course as of 07/24/22 1917  Mon Jul 24, 2022  1532 Patient somewhat tachycardic secondary to anxiety, she does not seem intoxicated on my exam, she endorses some passive SI but no active SI, plan, no HI, no AVH.  Her EtOH level is elevated consistent with endorse recent alcohol use, UDS positive for cocaine.  Overall she is cleared from medical perspective at this time, stable for TTS consult. [CP]    Clinical Course User Index [CP] Anselmo Pickler, PA-C                             Medical Decision  Making Risk Prescription drug management.   Patient is a 27 y.o. female  who presents to the emergency department for psychiatric complaint, and concern for substance abuse, alcohol, cocaine  Past Medical History: Previous history of depression, anxiety.  Patient  Physical Exam: Overall patient is well-appearing, she arrives mildly tachycardic, but may be secondary to her anxiety, or mild dehydration, she has evidence of alcohol in her bloodstream, but does not appear significantly intoxicated on my exam  Labs: Medical clearance labs ordered.  UA  overall unremarkable, there is small leukocytes and rare bacteria, but some squamous contamination, patient with no dysuria, low clinical suspicion for acute UTI.  Her CMP is notable for mild bicarb deficit CO2 21.  UDS positive for cocaine, CBC notable for mild leukocytosis, white blood cells 11.9.  Ethanol is elevated at 113.  Acetaminophen, salicylate levels are normal.  I independently interpreted an EKG which shows sinus tachycardia, these findings were confirmed by my attending Dr. Kathrynn Humble  Consult:  TTS was consulted on this patient, she was evaluated by the NP Vesta Mixer, who at this time reports that patient is cleared from their perspective, discussed inpatient admission for her passive suicidal thoughts, ultimately patient declined, as she does not have any active thoughts or plan I think this is reasonable at this time.  Resources provided for her substance abuse, EtOH.  At time of discharge patient has been in the emergency department for many hours, no longer clinically intoxicated, she did receive some Atarax for anxiety during her stay, and we will monitor to ensure that she is not too tired to drive home on discharge.  Medications: Zofran for nausea, Atarax for anxiety  Disposition: Patient is stable for discharge at this time with no remaining psychiatric emergency, resources provided for outpatient substance abuse  counseling and patient is within mental capacity to leave at this time with psychiatric clearance for her passive SI.  I discussed this case with my attending physician Dr. Matilde Sprang who cosigned this note including patient's presenting symptoms, physical exam, and planned diagnostics and interventions. Attending physician stated agreement with plan or made changes to plan which were implemented.   Final Clinical Impression(s) / ED Diagnoses Final diagnoses:  Suicidal thoughts  Alcohol abuse  Cocaine use disorder ([place])    Rx / DC Orders ED Discharge Orders          Ordered    hydrOXYzine (ATARAX) 25 MG tablet  3 times daily PRN        07/24/22 1837              Dorien Chihuahua 07/24/22 1917    Teressa Lower, MD 07/24/22 2357

## 2022-07-24 NOTE — ED Notes (Signed)
EDP at bedside to check on patient; Patient still sleeping peaceful with no acute distress; RN will allow patient to continue to sleep off medication before discharging due to patient drove self to ED. EDP made aware and AVS will be printed for future discharge-Monique,RN
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
## 2022-07-25 NOTE — Progress Notes (Signed)
Pt has been psych cleared per Vesta Mixer, NP. This CSW will now remove pt from the [facility] shift report. TOC to assist with any discharge needs.   Benjaman Kindler, MSW, LCSWA 07/25/2022 12:06 AM

## 2022-08-18 ENCOUNTER — Ambulatory Visit (HOSPITAL_COMMUNITY)
Admission: EM | Admit: 2022-08-18 | Discharge: 2022-08-18 | Disposition: A | Discharge status: Home / Self Care | Payer: No Typology Code available for payment source | Source: Ambulatory Visit | Attending: Emergency Medicine | Admitting: Emergency Medicine

## 2022-08-18 ENCOUNTER — Other Ambulatory Visit: Payer: Self-pay

## 2022-08-18 ENCOUNTER — Emergency Department (HOSPITAL_COMMUNITY)
Admission: EM | Admit: 2022-08-18 | Discharge: 2022-08-18 | Disposition: A | Discharge status: Home / Self Care | Payer: Medicaid Other | Attending: Emergency Medicine | Admitting: Emergency Medicine

## 2022-08-18 ENCOUNTER — Encounter (HOSPITAL_COMMUNITY): Payer: Self-pay

## 2022-08-18 DIAGNOSIS — R110 Nausea: Secondary | ICD-10-CM | POA: Insufficient documentation

## 2022-08-18 DIAGNOSIS — Z0441 Encounter for examination and observation following alleged adult rape: Secondary | ICD-10-CM | POA: Diagnosis present

## 2022-08-18 DIAGNOSIS — T7421XA Adult sexual abuse, confirmed, initial encounter: Secondary | ICD-10-CM | POA: Insufficient documentation

## 2022-08-18 DIAGNOSIS — F419 Anxiety disorder, unspecified: Secondary | ICD-10-CM | POA: Insufficient documentation

## 2022-08-18 LAB — COMPREHENSIVE METABOLIC PANEL
ALT: 16 U/L (ref 0–44)
AST: 23 U/L (ref 15–41)
Albumin: 4.1 g/dL (ref 3.5–5.0)
Alkaline Phosphatase: 60 U/L (ref 38–126)
Anion gap: 9 (ref 5–15)
BUN: 8 mg/dL (ref 6–20)
CO2: 25 mmol/L (ref 22–32)
Calcium: 9 mg/dL (ref 8.9–10.3)
Chloride: 102 mmol/L (ref 98–111)
Creatinine, Ser: 0.57 mg/dL (ref 0.44–1.00)
GFR, Estimated: >60 mL/min (ref >60)
Glucose, Bld: 91 mg/dL (ref 70–99)
Potassium: 3.6 mmol/L (ref 3.5–5.1)
Sodium: 136 mmol/L (ref 135–145)
Total Bilirubin: 0.8 mg/dL (ref 0.3–1.2)
Total Protein: 7.5 g/dL (ref 6.5–8.1)

## 2022-08-18 LAB — URINALYSIS, ROUTINE W REFLEX MICROSCOPIC
Bilirubin Urine: NEGATIVE
Glucose, UA: NEGATIVE mg/dL
Hgb urine dipstick: NEGATIVE
Ketones, ur: NEGATIVE mg/dL
Nitrite: NEGATIVE
Protein, ur: NEGATIVE mg/dL
Specific Gravity, Urine: 1.012 (ref 1.005–1.030)
pH: 6 (ref 5.0–8.0)

## 2022-08-18 LAB — HEPATITIS B SURFACE ANTIGEN: Hepatitis B Surface Ag: NONREACTIVE

## 2022-08-18 LAB — HEPATITIS C ANTIBODY: HCV Ab: NONREACTIVE

## 2022-08-18 LAB — RAPID HIV SCREEN (HIV 1/2 AB+AG)
HIV 1/2 Antibodies: NONREACTIVE
HIV-1 P24 Antigen - HIV24: NONREACTIVE

## 2022-08-18 LAB — POC URINE PREG, ED: Preg Test, Ur: NEGATIVE

## 2022-08-18 MED ORDER — LORAZEPAM 1 MG PO TABS
1.0000 mg | ORAL_TABLET | Freq: Once | ORAL | Status: AC
  Administered 2022-08-18: 1 mg via ORAL
  Filled 2022-08-18: qty 1

## 2022-08-18 MED ORDER — CEFTRIAXONE SODIUM 500 MG IJ SOLR
500.0000 mg | Freq: Once | INTRAMUSCULAR | Status: AC
  Administered 2022-08-18: 500 mg via INTRAMUSCULAR

## 2022-08-18 MED ORDER — ULIPRISTAL ACETATE 30 MG PO TABS
30.0000 mg | ORAL_TABLET | Freq: Once | ORAL | Status: AC
  Administered 2022-08-18: 30 mg via ORAL

## 2022-08-18 MED ORDER — PROMETHAZINE HCL 25 MG PO TABS
25.0000 mg | ORAL_TABLET | Freq: Four times a day (QID) | ORAL | Status: DC | PRN
  Administered 2022-08-18: 25 mg via ORAL

## 2022-08-18 MED ORDER — ONDANSETRON 4 MG PO TBDP
4.0000 mg | ORAL_TABLET | Freq: Once | ORAL | Status: AC
  Administered 2022-08-18: 4 mg via ORAL
  Filled 2022-08-18: qty 1

## 2022-08-18 MED ORDER — AZITHROMYCIN 250 MG PO TABS
1000.0000 mg | ORAL_TABLET | Freq: Once | ORAL | Status: AC
  Administered 2022-08-18: 1000 mg via ORAL

## 2022-08-18 MED ORDER — METRONIDAZOLE 500 MG PO TABS
2000.0000 mg | ORAL_TABLET | Freq: Once | ORAL | Status: AC
  Administered 2022-08-18: 2000 mg via ORAL

## 2022-08-18 MED ORDER — LIDOCAINE HCL (PF) 1 % IJ SOLN
1.0000 mL | Freq: Once | INTRAMUSCULAR | Status: AC
  Administered 2022-08-18: 2 mL
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
## 2022-08-18 NOTE — ED Notes (Addendum)
Urine Pregnancy POC reads Negative. ED-Lab
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
## 2022-08-18 NOTE — Discharge Instructions (Signed)
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 dietary supplements you use.   NOTES:  Do not share this medication with others.  If you think you have taken too much of this medicine, contact a poison control center or emergency room at  once. MISSED DOSE:  If you miss a dose, take it as soon as you can.  If it is almost time for your next dose, take only that dose.  Do not take double or extra doses. STORAGE:  Store at room temperature between 68-77 degrees F (20-25 degrees C), away from light and moisture.  Do not store in the bathroom.  Keep all medicines away from children and pets.  Do not flush medications down the toilet or pour them into the drain unless instructed to do so.  Properly discard this product when it is expired or no longer needed.  Consult your pharmacist or local waste disposal company for more details about how to safely discard this product.  Ulipristal  Tablets  What is this medication? ULIPRISTAL (UE li pris tal) can prevent pregnancy. It should be taken as soon as possible in the 5 days (120 hours) after unprotected sex or if you think your contraceptive didn't work. It belongs to a group of medications called emergency contraceptives. It does not prevent HIV or other sexually transmitted infections (STIs). This medicine may be used for other purposes; ask your health care provider or pharmacist if you have questions. COMMON BRAND NAME(S): ella What should I tell my care team before I take this medication? They need to know if you have any of these conditions: Liver disease An unusual or allergic reaction to ulipristal, other medications, foods, dyes, or preservatives Pregnant or trying to get pregnant Breast-feeding How should I use this medication? Take this medication by mouth with or without food. Your care team may want you to use a quick-response pregnancy test prior to using the tablets. Take your medication as soon as possible and not more than 5 days (120 hours) after the event. This medication can be taken at any time during your menstrual cycle. Follow the dose instructions of your care team exactly. Contact your care team right away if you vomit within 3 hours of taking your medication to discuss if you need to take another tablet. A patient package insert for the product will be given with each prescription and refill. Read this sheet carefully each time. The sheet may change frequently. Contact your care team about the use of this medication in children. Special care may be needed. Overdosage: If you think you have taken too much of this medicine contact a poison control center or emergency room at once. NOTE: This medicine is only for you. Do not share this medicine with others. What if I miss a dose? This medication is not for regular use. If you vomit within 3 hours of taking your dose, contact your care team for  instructions. What may interact with this medication? This medication may interact with the following: Barbiturates such as phenobarbital or primidone Birth control pills Bosentan Carbamazepine Certain medications for fungal infections like griseofulvin, itraconazole, and ketoconazole Certain medications for HIV or AIDS or hepatitis Dabigatran Digoxin Felbamate Fexofenadine Oxcarbazepine Phenytoin Rifampin St. John's Wort Topiramate This list may not describe all possible interactions. Give your health care provider a list of all the medicines, herbs, non-prescription drugs, or dietary supplements you use. Also tell them if you smoke, drink alcohol, or use illegal drugs. Some items may interact with your medicine. What should I watch for while using this medication? Your period may begin a few days earlier or later than expected. If your period is more than 7 days late, pregnancy is possible. See your care team as soon as you can  and get a pregnancy test. Talk to your care team before taking this medication if you know or suspect that you are pregnant. Contact your care team if you think you may be pregnant and you have taken this medication. If you have severe abdominal pain about 3 to 5 weeks after taking this medication, you may have a pregnancy outside the womb, which is called an ectopic or tubal pregnancy. Call your care team or go to the nearest emergency room right away if you think this is happening. Discuss birth control options with your care team. Emergency birth control is not to be used routinely to prevent pregnancy. It should not be used more than once in the same cycle. Birth control pills may not work properly while you are taking this medication. Wait at least 5 days after taking this medication to start or continue other hormone based birth control. Be sure to use a reliable barrier contraceptive method (such as a condom with spermicide) between the time you take this  medication and your next period. This medication does not protect you against HIV infection (AIDS) or any other sexually transmitted diseases (STDs). What side effects may I notice from receiving this medication? Side effects that you should report to your care team as soon as possible: Allergic reactions-skin rash, itching, hives, swelling of the face, lips, tongue, or throat Side effects that usually do not require medical attention (report to your care team if they continue or are bothersome): Dizziness Fatigue Headache Irregular menstrual cycles or spotting Menstrual cramps Nausea Stomach pain This list may not describe all possible side effects. Call your doctor for medical advice about side effects. You may report side effects to FDA at 1-800-FDA-1088.   Promethazine (pack of 3 for home use) Also known as:  Phenergan  Promethazine Tablets  What is this medication? PROMETHAZINE (proe METH a zeen) prevents and treats the symptoms of an allergic reaction. It works by blocking histamine, a substance released by the body during an allergic reaction. It may also help you relax, go to sleep, and relieve nausea, vomiting, or pain before or after procedures. It can also prevent and treat motion sickness. It works by helping your nervous system calm down by blocking substances in the body that may cause nausea and vomiting. It belongs to a group of medications called antihistamines. This medicine may be used for other purposes; ask your health care provider or pharmacist if you have questions. COMMON BRAND NAME(S): Phenergan What should I tell my care team before I take this medication? They need to know if you have any of these conditions: Blockage in your bowel Diabetes Glaucoma Have trouble controlling your muscles Heart disease Liver disease Low blood counts, like low white cell, platelet, or red cell counts Lung or breathing disease, like asthma Parkinson's disease Prostate  disease Seizures Stomach or intestine problems Trouble passing urine An unusual or allergic reaction to promethazine, sulfites, other medications, foods, dyes, or preservatives Pregnant or trying to get pregnant Breast-feeding How should I use this medication? Take this medication by mouth with a glass of water. Follow the directions on the prescription label. Take your doses at regular intervals. Do not take your medication more often than directed. Talk to your care team about the use of this medication in children. Special care may be needed. This medication should not be given to infants and children younger than 36 years old. Overdosage: If you think you have taken too much of this medicine contact a  poison control center or emergency room at once. NOTE: This medicine is only for you. Do not share this medicine with others. What if I miss a dose? If you miss a dose, take it as soon as you can. If it is almost time for your next dose, take only that dose. Do not take double or extra doses. What may interact with this medication? Alcohol Antihistamines for allergy, cough, and cold Atropine Certain medications for anxiety or sleep Certain medications for bladder problems like oxybutynin, tolterodine Certain medications for depression like amitriptyline, fluoxetine, sertraline Certain medications for Parkinson's disease like benztropine, trihexyphenidyl Certain medications for stomach problems like dicyclomine, hyoscyamine Certain medications for travel sickness like scopolamine Epinephrine General anesthetics like halothane, isoflurane, methoxyflurane, propofol Ipratropium MAOIs like Marplan, Nardil, and Parnate Medications for high blood pressure Medications for seizures like phenobarbital, primidone, phenytoin Medications that relax muscles for surgery Metoclopramide Narcotic medications for pain This list may not describe all possible interactions. Give your health care provider  a list of all the medicines, herbs, non-prescription drugs, or dietary supplements you use. Also tell them if you smoke, drink alcohol, or use illegal drugs. Some items may interact with your medicine. What should I watch for while using this medication? Visit your care team for regular checks on your progress. Tell your care team if symptoms do not start to get better or if they get worse. You may get drowsy or dizzy. Do not drive, use machinery, or do anything that needs mental alertness until you know how this medication affects you. To reduce the risk of dizzy or fainting spells, do not stand or sit up quickly, especially if you are an older patient. Alcohol may increase dizziness and drowsiness. Avoid alcoholic drinks. Your mouth may get dry. Chewing sugarless gum or sucking hard candy, and drinking plenty of water may help. Contact your care team if the problem does not go away or is severe. This medication may cause dry eyes and blurred vision. If you wear contact lenses you may feel some discomfort. Lubricating drops may help. See your care team if the problem does not go away or is severe. This medication can make you more sensitive to the sun. Keep out of the sun. If you cannot avoid being in the sun, wear protective clothing and use sunscreen. Do not use sun lamps or tanning beds/booths. This medication may increase blood sugar. Ask your care team if changes in diet or medications are needed if you have diabetes. What side effects may I notice from receiving this medication? Side effects that you should report to your care team as soon as possible: Allergic reactions-skin rash, itching, hives, swelling of the face, lips, tongue, or throat CNS depression-slow or shallow breathing, shortness of breath, feeling faint, dizziness, confusion, trouble staying awake High fever stiff muscles, increased sweating, fast or irregular heartbeat, and confusion, which may be signs of neuroleptic malignant  syndrome Infection-fever, chills, cough, or sore throat Liver injury-right upper belly pain, loss of appetite, nausea, light-colored stool, dark yellow or brown urine, yellowing skin or eyes, unusual weakness or fatigue Seizures Sudden eye pain or change in vision such as blurry vision, seeing halos around lights, vision loss Trouble passing urine Uncontrolled and repetitive body movements, muscle stiffness or spasms, tremors or shaking, loss of balance or coordination, restlessness, shuffling walk, which may be signs of extrapyramidal symptoms (EPS) Side effects that usually do not require medical attention (report to your care team if they continue or are bothersome): Confusion  Constipation Dizziness Drowsiness Dry mouth Sensitivity to light Vivid dreams or nightmares This list may not describe all possible side effects. Call your doctor for medical advice about side effects. You may report side effects to FDA at 1-800-FDA-1088. Where should I keep my medication? Keep out of the reach of children. Store at room temperature, between 20 and 25 degrees C (68 and 77 degrees F). Protect from light. Throw away any unused medication after the expiration date. NOTE: This sheet is a summary. It may not cover all possible information. If you have questions about this medicine, talk to your doctor, pharmacist, or health care provider.  2022 Elsevier/Gold Standard (2020-09-14 10:57:27)

## 2022-08-18 NOTE — ED Notes (Signed)
Pt ambulatory with independent steady gait.

## 2022-08-19 LAB — RPR: RPR Ser Ql: NONREACTIVE

## 2022-08-23 ENCOUNTER — Telehealth: Payer: Self-pay | Admitting: Family Medicine

## 2022-08-23 NOTE — Telephone Encounter (Signed)
Called patient to set up nurse visit, there was no answer to the phone call so a voicemail was left with the call back number for the office and a letter was mailed.

## 2022-08-30 NOTE — Progress Notes (Deleted)
Possible Pregnancy  Here today for pregnancy confirmation. UPT in office today is {Desc; negative/positive:13464}. Pt reports first positive home UPT on ***. Reviewed dating with patient:   LMP: *** EDD: *** ***w ***d today  OB history reviewed. Reviewed medications and allergies with patient; list of medications safe to take during pregnancy given.  Recommended pt begin prenatal vitamin and schedule prenatal care.  Patient requesting STD testing as well.   Alinda Money, RN 08/30/2022  5:30 PM

## 2022-08-31 ENCOUNTER — Ambulatory Visit: Payer: Medicaid Other

## 2022-09-01 ENCOUNTER — Telehealth: Payer: Medicaid Other | Admitting: Family Medicine

## 2022-09-01 DIAGNOSIS — B3731 Acute candidiasis of vulva and vagina: Secondary | ICD-10-CM

## 2022-09-01 DIAGNOSIS — B9689 Other specified bacterial agents as the cause of diseases classified elsewhere: Secondary | ICD-10-CM | POA: Diagnosis not present

## 2022-09-01 DIAGNOSIS — N760 Acute vaginitis: Secondary | ICD-10-CM | POA: Diagnosis not present

## 2022-09-01 MED ORDER — FLUCONAZOLE 150 MG PO TABS: 150.0000 mg | ORAL_TABLET | Freq: Once | ORAL | 0 refills | Status: AC

## 2022-09-01 MED ORDER — METRONIDAZOLE 500 MG PO TABS: 500.0000 mg | ORAL_TABLET | Freq: Two times a day (BID) | ORAL | 0 refills | Status: AC

## 2022-09-01 NOTE — Progress Notes (Signed)
E-Visit for Vaginal Symptoms  We are sorry that you are not feeling well. Here is how we plan to help! Based on what you shared with me it looks like you: May have a vaginosis due to bacteria  Vaginosis is an inflammation of the vagina that can result in discharge, itching and pain. The cause is usually a change in the normal balance of vaginal bacteria or an infection. Vaginosis can also result from reduced estrogen levels after menopause.  The most common causes of vaginosis are:   Bacterial vaginosis which results from an overgrowth of one on several organisms that are normally present in your vagina.   Yeast infections which are caused by a naturally occurring fungus called candida.   Vaginal atrophy (atrophic vaginosis) which results from the thinning of the vagina from reduced estrogen levels after menopause.   Trichomoniasis which is caused by a parasite and is commonly transmitted by sexual intercourse.  Factors that increase your risk of developing vaginosis include: Medications, such as antibiotics and steroids Uncontrolled diabetes Use of hygiene products such as bubble bath, vaginal spray or vaginal deodorant Douching Wearing damp or tight-fitting clothing Using an intrauterine device (IUD) for birth control Hormonal changes, such as those associated with pregnancy, birth control pills or menopause Sexual activity Having a sexually transmitted infection  Your treatment plan is Metronidazole or Flagyl 500mg  twice a day for 7 days.  I have electronically sent this prescription into the pharmacy that you have chosen. I have also sent diflucan. Be sure to take all of the medication as directed. Stop taking any medication if you develop a rash, tongue swelling or shortness of breath. Mothers who are breast feeding should consider pumping and discarding their breast milk while on these antibiotics. However, there is no consensus that infant exposure at these doses would be  harmful.  Remember that medication creams can weaken latex condoms. [name]   HOME CARE:  Good hygiene may prevent some types of vaginosis from recurring and may relieve some symptoms:  Avoid baths, hot tubs and whirlpool spas. Rinse soap from your outer genital area after a shower, and dry the area well to prevent irritation. Don't use scented or harsh soaps, such as those with deodorant or antibacterial action. Avoid irritants. These include scented tampons and pads. Wipe from front to back after using the toilet. Doing so avoids spreading fecal bacteria to your vagina.  Other things that may help prevent vaginosis include:  Don't douche. Your vagina doesn't require cleansing other than normal bathing. Repetitive douching disrupts the normal organisms that reside in the vagina and can actually increase your risk of vaginal infection. Douching won't clear up a vaginal infection. Use a latex condom. Both female and female latex condoms may help you avoid infections spread by sexual contact. Wear cotton underwear. Also wear pantyhose with a cotton crotch. If you feel comfortable without it, skip wearing underwear to bed. Yeast thrives in [employer] Your symptoms should improve in the next day or two.  GET HELP RIGHT AWAY IF:  You have pain in your lower abdomen ( pelvic area or over your ovaries) You develop nausea or vomiting You develop a fever Your discharge changes or worsens You have persistent pain with intercourse You develop shortness of breath, a rapid pulse, or you faint.  These symptoms could be signs of problems or infections that need to be evaluated by a medical provider now.  MAKE SURE YOU   Understand these instructions. Will watch your condition.  Will get help right away if you are not doing well or get worse.  Thank you for choosing an e-visit.  Your e-visit answers were reviewed by a board certified advanced clinical practitioner to complete your personal care  plan. Depending upon the condition, your plan could have included both over the counter or prescription medications.  Please review your pharmacy choice. Make sure the pharmacy is open so you can pick up prescription now. If there is a problem, you may contact your provider through [employer] and have the prescription routed to another pharmacy.  Your safety is important to [place]. If you have drug allergies check your prescription carefully.   For the next 24 hours you can use MyChart to ask questions about today's visit, request a non-urgent call back, or ask for a work or school excuse. You will get an email in the next two days asking about your experience. I hope that your e-visit has been valuable and will speed your recovery.

## 2022-09-08 NOTE — Progress Notes (Signed)
I have provided 5 minutes of non face to face time during this encounter for chart review and documentation.   

## 2022-12-14 ENCOUNTER — Telehealth: Payer: Medicaid Other | Admitting: Physician Assistant

## 2022-12-14 DIAGNOSIS — M5412 Radiculopathy, cervical region: Secondary | ICD-10-CM

## 2022-12-14 MED ORDER — METHYLPREDNISOLONE 4 MG PO TBPK: ORAL_TABLET | ORAL | 0 refills | Status: DC

## 2022-12-14 MED ORDER — CYCLOBENZAPRINE HCL 10 MG PO TABS: 5.0000 mg | ORAL_TABLET | Freq: Three times a day (TID) | ORAL | 0 refills | Status: DC | PRN

## 2022-12-14 NOTE — Patient Instructions (Signed)
Traci Campbell, thank you for joining Margaretann Loveless, PA-C for today's virtual visit.  While this provider is not your primary care provider (PCP), if your PCP is located in our provider database this encounter information will be shared with them immediately following your visit.   A [facility] MyChart account gives you access to today's visit and all your visits, tests, and labs performed at [facility] " click here if you don't have a [facility] MyChart account or go to [email or web address]  Consent: (Patient) Traci Campbell provided verbal consent for this virtual visit at the beginning of the encounter.  Current Medications:  Current Outpatient Medications:    cyclobenzaprine (FLEXERIL) 10 MG tablet, Take 0.5-1 tablets (5-10 mg total) by mouth 3 (three) times daily as needed., Disp: 30 tablet, Rfl: 0   methylPREDNISolone (MEDROL DOSEPAK) 4 MG TBPK tablet, 6 day taper; take as directed on package instructions, Disp: 21 tablet, Rfl: 0   benzonatate (TESSALON) 100 MG capsule, Take 1 capsule (100 mg total) by mouth 3 (three) times daily as needed for cough., Disp: 30 capsule, Rfl: 0   escitalopram (LEXAPRO) 20 MG tablet, Take 20 mg by mouth daily., Disp: , Rfl:    levocetirizine (XYZAL) 5 MG tablet, Take 5 mg by mouth daily., Disp: , Rfl:    Medications ordered in this encounter:  Meds ordered this encounter  Medications   methylPREDNISolone (MEDROL DOSEPAK) 4 MG TBPK tablet    Sig: 6 day taper; take as directed on package instructions    Dispense:  21 tablet    Refill:  0    Order Specific Question:   Supervising Provider    Answer:   Merrilee Jansky [[id number]]   cyclobenzaprine (FLEXERIL) 10 MG tablet    Sig: Take 0.5-1 tablets (5-10 mg total) by mouth 3 (three) times daily as needed.    Dispense:  30 tablet    Refill:  0    Order Specific Question:   Supervising Provider    Answer:   Merrilee Jansky [id number]     *If you need refills on other medications  prior to your next appointment, please contact your pharmacy*  Follow-Up: Call back or seek an in-person evaluation if the symptoms worsen or if the condition fails to improve as anticipated.  [facility] [phone]  Other Instructions  [facility] Location: [facility] [address] [place], [place] [address] Phone: [phone] Convenient hours: Monday - Friday: 11 a.m. - 7 p.m. Visit our website to schedule an appointment online or walk-in during clinic hours. Your well-being is our priority. Move better with [place]!   [facility] [address] [place], [place] [address] URGENT CARE HOURS Monday - Friday: 8:00am to 8:00pm Saturday: 10:00am to 3:00pm [phone]    If you have been instructed to have an in-person evaluation today at a local Urgent Care facility, please use the link below. It will take you to a list of all of our available [facility], including address, phone number and hours of operation. Please do not delay care.  [facility]  If you or a family member do not have a primary care provider, use the link below to schedule a visit and establish care. When you choose a [facility] primary care physician or advanced practice provider, you gain a long-term partner in health. Find a Primary Care Provider  Learn more about 's  in-office and virtual care options: [facility]

## 2022-12-14 NOTE — Progress Notes (Signed)
Virtual Visit Consent   Traci Campbell, you are scheduled for a virtual visit with a [facility] provider today. Just as with appointments in the office, your consent must be obtained to participate. Your consent will be active for this visit and any virtual visit you may have with one of our providers in the next 365 days. If you have a MyChart account, a copy of this consent can be sent to you electronically.  As this is a virtual visit, video technology does not allow for your provider to perform a traditional examination. This may limit your provider's ability to fully assess your condition. If your provider identifies any concerns that need to be evaluated in person or the need to arrange testing (such as labs, EKG, etc.), we will make arrangements to do so. Although advances in technology are sophisticated, we cannot ensure that it will always work on either your end or our end. If the connection with a video visit is poor, the visit may have to be switched to a telephone visit. With either a video or telephone visit, we are not always able to ensure that we have a secure connection.  By engaging in this virtual visit, you consent to the provision of healthcare and authorize for your insurance to be billed (if applicable) for the services provided during this visit. Depending on your insurance coverage, you may receive a charge related to this service.  I need to obtain your verbal consent now. Are you willing to proceed with your visit today? Traci Campbell has provided verbal consent on 12/14/2022 for a virtual visit (video or telephone). Margaretann Loveless, PA-C  Date: 12/14/2022 7:45 PM  Virtual Visit via Video Note   I, Margaretann Loveless, connected with  Traci Campbell  ([id number], April 29, 1996) on 12/14/22 at  7:30 PM EDT by a video-enabled telemedicine application and verified that I am speaking with the correct person using two identifiers.  Location: Patient: Virtual Visit Location Patient:  Home Provider: Virtual Visit Location Provider: Home Office   I discussed the limitations of evaluation and management by telemedicine and the availability of in person appointments. The patient expressed understanding and agreed to proceed.    History of Present Illness: Traci Campbell is a 27 y.o. who identifies as a female who was assigned female at birth, and is being seen today for right hand numbness and pain. Has been worsening over the last few weeks. Is worse when using the arm. Radiates from the upper shoulder/neck area to the fingers. Feels most in the forearm area. No known injury. Reports having "circulation issues" in her hands and feet at baseline. Has increased walking and trying to keep them warm. Possibly describing a Raynaud's without an actual diagnosis.   Problems:  Patient Active Problem List   Diagnosis Date Noted   Cocaine abuse (HCC) 07/24/2022   Alcohol abuse 07/24/2022   Substance induced mood disorder (HCC) 07/24/2022   Unsuccessful IUD insertion 06/08/2020   Endometrial thickening on ultrasound 07/24/2018    Allergies: No Known Allergies Medications:  Current Outpatient Medications:    cyclobenzaprine (FLEXERIL) 10 MG tablet, Take 0.5-1 tablets (5-10 mg total) by mouth 3 (three) times daily as needed., Disp: 30 tablet, Rfl: 0   methylPREDNISolone (MEDROL DOSEPAK) 4 MG TBPK tablet, 6 day taper; take as directed on package instructions, Disp: 21 tablet, Rfl: 0   benzonatate (TESSALON) 100 MG capsule, Take 1 capsule (100 mg total) by mouth 3 (three) times daily as  needed for cough., Disp: 30 capsule, Rfl: 0   escitalopram (LEXAPRO) 20 MG tablet, Take 20 mg by mouth daily., Disp: , Rfl:    levocetirizine (XYZAL) 5 MG tablet, Take 5 mg by mouth daily., Disp: , Rfl:   Observations/Objective: Patient is well-developed, well-nourished in no acute distress.  Resting comfortably at home.  Head is normocephalic, atraumatic.  No labored breathing.  Speech is clear and  coherent with logical content.  Patient is alert and oriented at baseline.    Assessment and Plan: 1. Cervical radiculopathy - methylPREDNISolone (MEDROL DOSEPAK) 4 MG TBPK tablet; 6 day taper; take as directed on package instructions  Dispense: 21 tablet; Refill: 0 - cyclobenzaprine (FLEXERIL) 10 MG tablet; Take 0.5-1 tablets (5-10 mg total) by mouth 3 (three) times daily as needed.  Dispense: 30 tablet; Refill: 0  - DDx: muscle spasm and tension, disc herniation/bulge, DDD, other cervical abnormality - Will treat with Medrol and Flexeril - Heat - Stretches - Rest - Seek in person evaluation at [facility] if not improving (information supplied in AVS)  Follow Up Instructions: I discussed the assessment and treatment plan with the patient. The patient was provided an opportunity to ask questions and all were answered. The patient agreed with the plan and demonstrated an understanding of the instructions.  A copy of instructions were sent to the patient via MyChart unless otherwise noted below.    The patient was advised to call back or seek an in-person evaluation if the symptoms worsen or if the condition fails to improve as anticipated.  Time:  I spent 15 minutes with the patient via telehealth technology discussing the above problems/concerns.    Margaretann Loveless, PA-C

## 2023-04-11 ENCOUNTER — Telehealth: Payer: Medicaid Other | Admitting: Physician Assistant

## 2023-04-11 DIAGNOSIS — R6889 Other general symptoms and signs: Secondary | ICD-10-CM | POA: Diagnosis not present

## 2023-04-11 MED ORDER — BENZONATATE 100 MG PO CAPS: 100.0000 mg | ORAL_CAPSULE | Freq: Three times a day (TID) | ORAL | 0 refills | Status: DC | PRN

## 2023-04-11 MED ORDER — FLUTICASONE PROPIONATE 50 MCG/ACT NA SUSP: 2.0000 | Freq: Every day | NASAL | 0 refills | Status: DC

## 2023-04-11 NOTE — Progress Notes (Signed)
E visit for Flu like symptoms   We are sorry that you are not feeling well.  Here is how we plan to help! Based on what you have shared with me it looks like you may have flu-like symptoms that should be watched but do not seem to indicate anti-viral treatment.  Influenza or "the flu" is   an infection caused by a respiratory virus. The flu virus is highly contagious and persons who did not receive their yearly flu vaccination may "catch" the flu from close contact.  We have anti-viral medications to treat the viruses that cause this infection. They are not a "cure" and only shorten the course of the infection. These prescriptions are most effective when they are given within the first 2 days of "flu" symptoms. Antiviral medication are indicated if you have a high risk of complications from the flu. You should  also consider an antiviral medication if you are in close contact with someone who is at risk. These medications can help patients avoid complications from the flu  but have side effects that you should know. Possible side effects from Tamiflu or oseltamivir include nausea, vomiting, diarrhea, dizziness, headaches, eye redness, sleep problems or other respiratory symptoms. You should not take Tamiflu if you have an allergy to oseltamivir or any to the ingredients in Tamiflu.  Based upon your symptoms and potential risk factors I recommend that you follow the flu symptoms recommendation that I have listed below.  Symptoms vary from person to person, with common symptoms including sore throat, cough, fatigue or lack of energy and feeling of general discomfort.  A low-grade fever of up to 100.4 may present, but is often uncommon.  Symptoms vary however, and are closely related to a person's age or underlying illnesses.  The most common symptoms associated with an upper respiratory infection are nasal discharge or congestion, cough, sneezing, headache and pressure in the ears and face.  These symptoms  usually persist for about 3 to 10 days, but can last up to 2 weeks.  It is important to know that upper respiratory infections do not cause serious illness or complications in most cases.    Upper respiratory infections can be transmitted from person to person, with the most common method of transmission being a person's hands.  The virus is able to live on the skin and can infect other persons for up to 2 hours after direct contact.  Also, these can be transmitted when someone coughs or sneezes; thus, it is important to cover the mouth to reduce this risk.  To keep the spread of the illness at bay, good hand hygiene is very important.  This is an infection that is most likely caused by a virus. There are no specific treatments other than to help you with the symptoms until the infection runs its course.  We are sorry you are not feeling well.  Here is how we plan to help!  For nasal congestion, you may use an oral decongestants such as Mucinex D or if you have glaucoma or high blood pressure use plain Mucinex.  Saline nasal spray or nasal drops can help and can safely be used as often as needed for congestion.  For your congestion, I have prescribed Fluticasone nasal spray one spray in each nostril twice a day  If you do not have a history of heart disease, hypertension, diabetes or thyroid disease, prostate/bladder issues or glaucoma, you may also use Sudafed to treat nasal congestion.  It is  highly recommended that you consult with a pharmacist or your primary care physician to ensure this medication is safe for you to take.     If you have a cough, you may use cough suppressants such as Delsym and Robitussin.  If you have glaucoma or high blood pressure, you can also use Coricidin HBP.   For cough I have prescribed for you A prescription cough medication called Tessalon Perles 100 mg. You may take 1-2 capsules every 8 hours as needed for cough  If you have a sore or scratchy throat, use a saltwater  gargle-  to  teaspoon of salt dissolved in a 4-ounce to 8-ounce glass of warm water.  Gargle the solution for approximately 15-30 seconds and then spit.  It is important not to swallow the solution.  You can also use throat lozenges/cough drops and Chloraseptic spray to help with throat pain or discomfort.  Warm or cold liquids can also be helpful in relieving throat pain.  For headache, pain or general discomfort, you can use Ibuprofen or Tylenol as directed.   Some authorities believe that zinc sprays or the use of Echinacea may shorten the course of your symptoms.  Push fluids, especially electrolyte beverages.  ANYONE WHO HAS FLU SYMPTOMS SHOULD: Stay home. The flu is highly contagious and going out or to work exposes others! Be sure to drink plenty of fluids. Water is fine as well as fruit juices, sodas and electrolyte beverages. You may want to stay away from caffeine or alcohol. If you are nauseated, try taking small sips of liquids. How do you know if you are getting enough fluid? Your urine should be a pale yellow or almost colorless. Get rest. Taking a steamy shower or using a humidifier may help nasal congestion and ease sore throat pain. Using a saline nasal spray works much the same way. Cough drops, hard candies and sore throat lozenges may ease your cough. Line up a caregiver. Have someone check on you regularly.   GET HELP RIGHT AWAY IF: You cannot keep down liquids or your medications. You become short of breath Your fell like you are going to pass out or loose consciousness. Your symptoms persist after you have completed your treatment plan MAKE SURE YOU  Understand these instructions. Will watch your condition. Will get help right away if you are not doing well or get worse.  Your e-visit answers were reviewed by a board certified advanced clinical practitioner to complete your personal care plan.  Depending on the condition, your plan could have included both over the  counter or prescription medications.  If there is a problem please reply  once you have received a response from your provider.  Your safety is important to [place].  If you have drug allergies check your prescription carefully.    You can use MyChart to ask questions about today's visit, request a non-urgent call back, or ask for a work or school excuse for 24 hours related to this e-Visit. If it has been greater than 24 hours you will need to follow up with your provider, or enter a new e-Visit to address those concerns.  You will get an e-mail in the next two days asking about your experience.  I hope that your e-visit has been valuable and will speed your recovery. Thank you for using e-visits.   I have spent 5 minutes in review of e-visit questionnaire, review and updating patient chart, medical decision making and response to patient.   Victorino Dike  Epifania Gore, PA-C

## 2023-06-18 ENCOUNTER — Ambulatory Visit (INDEPENDENT_AMBULATORY_CARE_PROVIDER_SITE_OTHER): Payer: Medicaid Other | Admitting: *Deleted

## 2023-06-18 DIAGNOSIS — Z3201 Encounter for pregnancy test, result positive: Secondary | ICD-10-CM | POA: Diagnosis not present

## 2023-06-18 DIAGNOSIS — Z3200 Encounter for pregnancy test, result unknown: Secondary | ICD-10-CM

## 2023-06-18 DIAGNOSIS — O3680X0 Pregnancy with inconclusive fetal viability, not applicable or unspecified: Secondary | ICD-10-CM

## 2023-06-18 LAB — POCT PREGNANCY, URINE: Preg Test, Ur: POSITIVE — AB

## 2023-06-18 NOTE — Progress Notes (Addendum)
Pt submitted urine for pregnancy test which is positive. I called her and informed of test results. She reports sure LMP 05/02/23 which yields EDD 02/06/24, now [redacted]w[redacted]d. She denies vaginal bleeding or abdominal pain. She was advised to go to [facility] if she develops these issues. Pt scheduled for viability ultrasound on 06/27/23 @ 9:15 am. She was advised to begin taking Prenatal vitamins. She will be scheduled for New Ob intake and New Ob provider appointments. Pt voiced understanding of all information and instructions given.

## 2023-06-18 NOTE — Addendum Note (Signed)
Addended by: Jill Side on: 06/18/2023 05:45 PM   Modules accepted: Orders

## 2023-06-20 NOTE — L&D Delivery Note (Signed)
 [facility] Delivery Note  Traci Campbell is a 28 y.o. G1P0000 s/p vag delivery at [redacted]w[redacted]d. She was admitted for postdates IOL.   ROM: 11h 32m with clear fluid GBS Status: neg Maximum Maternal Temperature: 99.1  Labor Progress: [name] was admitted for IOL due to postdates; she had the usual cervical ripening measures followed by Pitocin  and AROM prior to progressing to complete and pushing <65mins to vag delivery.  Delivery Date/Time: September 3rd, 2025 at 2045 Delivery: Called to room and patient was complete and pushing. Head delivered ROA. No nuchal cord present. Shoulder and body delivered in usual fashion. Infant with spontaneous cry, placed on mother's abdomen, dried and stimulated. Cord clamped x 2 after 1-minute delay, and cut by FOB. Cord blood drawn. Placenta delivered spontaneously with gentle cord traction. Fundus firm with massage and Pitocin . Labia, perineum, vagina, and cervix inspected and found to have a 1st deg perineal lac.   Placenta: spont, intact; to L&D; succenturiate lobe noted and entire placenta appears complete Complications: none Lacerations: 1st deg perineal repaired with 3-0 Monocryl in the usual fashion EBL: 214cc Analgesia: epidural  Postpartum Planning [x]  message to sent to schedule follow-up   Infant: girl  APGARs 8/9  3060g (6lb 11.9oz)  Suzen JONETTA Gentry, CNM  02/20/2024 10:00 PM

## 2023-06-27 ENCOUNTER — Other Ambulatory Visit: Payer: Medicaid Other

## 2023-06-28 ENCOUNTER — Other Ambulatory Visit: Payer: Medicaid Other

## 2023-07-02 ENCOUNTER — Ambulatory Visit (HOSPITAL_COMMUNITY)
Admission: RE | Admit: 2023-07-02 | Discharge: 2023-07-02 | Disposition: A | Discharge status: Home / Self Care | Payer: Medicaid Other | Source: Ambulatory Visit | Attending: Advanced Practice Midwife | Admitting: Advanced Practice Midwife

## 2023-07-02 DIAGNOSIS — O3680X0 Pregnancy with inconclusive fetal viability, not applicable or unspecified: Secondary | ICD-10-CM | POA: Insufficient documentation

## 2023-07-07 ENCOUNTER — Other Ambulatory Visit: Payer: Self-pay | Admitting: Advanced Practice Midwife

## 2023-07-17 ENCOUNTER — Telehealth: Payer: Medicaid Other

## 2023-07-23 ENCOUNTER — Encounter: Payer: Medicaid Other | Admitting: Obstetrics and Gynecology

## 2023-07-29 ENCOUNTER — Telehealth: Payer: Medicaid Other | Admitting: Family

## 2023-07-29 DIAGNOSIS — Z3490 Encounter for supervision of normal pregnancy, unspecified, unspecified trimester: Secondary | ICD-10-CM

## 2023-07-29 DIAGNOSIS — R112 Nausea with vomiting, unspecified: Secondary | ICD-10-CM

## 2023-07-29 NOTE — Progress Notes (Signed)
  Because you are pregnant and vomiting, I feel your condition warrants further evaluation and I recommend that you be seen in a face-to-face visit.   NOTE: There will be NO CHARGE for this E-Visit   If you are having a true medical emergency, please call 911.     For an urgent face to face visit, [facility] has multiple urgent care centers for your convenience.  Click the link below for the full list of locations and hours, walk-in wait times, appointment scheduling options and driving directions:  Urgent Care - [place], [place], [place], [place], [place], [place]  [place]     Your MyChart E-visit questionnaire answers were reviewed by a board certified advanced clinical practitioner to complete your personal care plan based on your specific symptoms.    Thank you for using e-Visits.

## 2023-08-07 ENCOUNTER — Telehealth: Payer: Medicaid Other | Admitting: *Deleted

## 2023-08-07 DIAGNOSIS — O219 Vomiting of pregnancy, unspecified: Secondary | ICD-10-CM

## 2023-08-07 DIAGNOSIS — Z3A13 13 weeks gestation of pregnancy: Secondary | ICD-10-CM

## 2023-08-07 DIAGNOSIS — O30049 Twin pregnancy, dichorionic/diamniotic, unspecified trimester: Secondary | ICD-10-CM | POA: Diagnosis not present

## 2023-08-07 DIAGNOSIS — O0990 Supervision of high risk pregnancy, unspecified, unspecified trimester: Secondary | ICD-10-CM | POA: Insufficient documentation

## 2023-08-07 MED ORDER — PROMETHAZINE HCL 25 MG PO TABS: 25.0000 mg | ORAL_TABLET | Freq: Four times a day (QID) | ORAL | 0 refills | Status: DC | PRN

## 2023-08-07 MED ORDER — BLOOD PRESSURE KIT DEVI: 1.0000 | 0 refills | Status: AC | PRN

## 2023-08-07 NOTE — Progress Notes (Signed)
 New OB Intake  I connected with Traci Campbell  on 08/07/23 at  1:15 PM EST by MyChart Video Visit and verified that I am speaking with the correct person using two identifiers. Nurse is located at [facility] and pt is located at home.  I discussed the limitations, risks, security and privacy concerns of performing an evaluation and management service by telephone and the availability of in person appointments. I also discussed with the patient that there may be a patient responsible charge related to this service. The patient expressed understanding and agreed to proceed.  I explained I am completing New OB Intake today. We discussed EDD of 02/13/24 based on [place].Traci Campbell Pt is G1P0. I reviewed her allergies, medications and Medical/Surgical/OB history.    Patient Active Problem List   Diagnosis Date Noted   Supervision of high risk pregnancy, antepartum 08/07/2023   Twin pregnancy, twins dichorionic and diamniotic 08/07/2023   Cocaine abuse (HCC) 07/24/2022   Alcohol abuse 07/24/2022   Substance induced mood disorder (HCC) 07/24/2022   Unsuccessful IUD insertion 06/08/2020   Endometrial thickening on ultrasound 07/24/2018    Concerns addressed today  Delivery Plans Plans to deliver at [facility]. Discussed the nature of our practice with multiple providers including residents and students. Due to the size of the practice, the delivering provider may not be the same as those providing prenatal care.   Patient is not interested in water birth.   MyChart/Babyscripts MyChart access verified. I explained pt will have some visits in office and some virtually. Babyscripts instructions given and order placed.  Blood Pressure Cuff/Weight Scale Blood pressure cuff ordered for patient to pick-up from [employer]. Explained after first prenatal appt pt will check weekly and document in Babyscripts. Patient does have weight scale.  Anatomy US Explained next scheduled [place] will be around 19 weeks. Anatomy US  scheduled for 09/19/23 at 0930.  Is patient a CenteringPregnancy candidate?  Declined Declined due to Group setting   Is patient a Mom+Baby Combined Care candidate?  Declined    Interested in [place]?  Declines.   Is patient a candidate for Babyscripts Optimization? No, due to twins   First visit review I reviewed new OB appt with patient. Explained pt will be seen by Dr. Shawnie Pons at first visit. Discussed Traci Campbell genetic screening with patient. She would like both Panorama and Horizon  drawn with routine prenatal labs at new ob visit.    Last Pap Diagnosis  Date Value Ref Range Status  03/14/2018   Final   NEGATIVE FOR INTRAEPITHELIAL LESIONS OR MALIGNANCY.  03/14/2018 SHIFT IN FLORA SUGGESTIVE OF BACTERIAL VAGINOSIS.  Final    Nancy Fetter 08/07/2023  2:03 PM

## 2023-08-07 NOTE — Patient Instructions (Signed)
 Multiple Pregnancy Multiple pregnancy means that a woman is carrying more than one baby at a time. She may be pregnant with twins, triplets, or more. Most multiple pregnancies are twins. It is rare for a woman to get pregnant naturally with triplets or more (higher-order multiples). Multiple pregnancies come with more risks than single pregnancies. A woman with a multiple pregnancy is more likely to have certain problems during her pregnancy. How does a multiple pregnancy happen? A multiple pregnancy happens when: Your body releases more than one egg at a time. Then each egg is fertilized by a different sperm. This is the most common type of multiple pregnancy. This is more likely to happen if you are older when you become pregnant. The twins or multiples produced this way are called fraternal. They are no more alike than non-multiple siblings. One sperm fertilizes one egg. The egg then divides into more than one embryo. The twins or multiples produced this way are called identical. They are always the same gender. They also look a lot alike. Who is most likely to have a multiple pregnancy? A multiple pregnancy is more likely to happen if: You have had fertility treatment. This is especially true if you used fertility medicines. You are older than 30 years of age. You have already had four or more children. You have had a previous multiple pregnancy. You have a family history of multiple pregnancy. How is a multiple pregnancy diagnosed? A multiple pregnancy may be diagnosed based on symptoms. These may include: Rapid weight gain in the first 3 months of pregnancy (first trimester). More severe nausea and breast tendernessthan expected in a single pregnancy. A larger uterus than what is normal for the stage of pregnancy. You may also have tests, including: Blood tests to detect a higher-than-expected level of human chorionic gonadotropin (hCG). hCG is a hormone that your body makes in early  pregnancy. An ultrasound exam. This is used to confirm you are carrying multiples. What risks come with multiple pregnancy? A multiple pregnancy puts you at higher risk for certain problems during or after your pregnancy. These include: Delivering your babies before your due date (preterm birth). A full-term pregnancy lasts for at least 37 weeks. Babies born before 37 weeks may be more likely to have breathing problems, trouble feeding, and physical and learning disabilities. Gestational diabetes. This is high blood sugar only during pregnancy. Preeclampsia. This is a serious condition that causes high blood pressure and headaches during pregnancy. Too much blood loss after childbirth (postpartum hemorrhage). Postpartum depression. Babies with low birth weight. How will having a multiple pregnancy affect my care? Your health care team will monitor you more closely. You may need more prenatal visits and ultrasounds. These will ensure that you and your babies are healthy. Follow these instructions at home: Eating and drinking Improve your nutrition and increase your calorie intake. Follow instructions from your health care provider about weight gain. You may need to gain extra weight when you are pregnant with multiples. Eat healthy snacks often throughout the day. This will add calories and reduce nausea. Do not drink alcohol. Drink enough fluid to keep your urine pale yellow. Take prenatal vitamins. Ask your health care provider what vitamins are right for you. Activity You may need to limit your activities and how much you exercise. This will depend on how your pregnancy progresses and if you have any complications. Follow instructions from your health care provider about what activities are safe for you. Your health care provider  may instruct you to: Rest often. Avoid activities, exercise, and work that take a lot of effort. General instructions Do not use any products that contain  nicotine or tobacco. These products include cigarettes, chewing tobacco, and vaping devices, such as e-cigarettes. If you need help quitting, ask your health care provider. Do not use illegal drugs. Take over-the-counter and prescription medicines only as told by your health care provider. Arrange for extra help around the house. Keep all follow-up visits and all prenatal visits due to the risks that come with a multiple pregnancy. Where to find more information [employer]: acog.org March of [employer]: marchofdimes.org Contact a health care provider if: You feel dizzy. You have nausea, vomiting, or diarrhea that does not go away. You feel depressed or have other emotions that interfere with your normal activities. You notice increased swelling in your face, hands, legs, or ankles. You have a fever. You have pain when you urinate or bad-smelling discharge from your vagina. You have a severe headache, with or without changes in vision. Get help right away if: You have blood or fluid leaking from your vagina. You feel cramping or pressure in your pelvis. You have pain in your abdomen or lower back. You are having regular contractions. You have chest pain or shortness of breath. Your babies move less often, or do not move at all. Summary Multiple pregnancy means that a woman is carrying more than one baby at a time. Multiple pregnancy puts you at higher risk for certain problems, such as preterm birth, gestational diabetes, or too much blood loss after childbirth. You will be monitored by your health care team. You may have more testing done. Do not use any products with tobacco or nicotine, drink alcohol, or use any illegal drugs. Let your health care provider know if you have any problems during your pregnancy. Keep all prenatal visits. This information is not intended to replace advice given to you by your health care provider. Make sure you discuss any  questions you have with your health care provider. Document Revised: 09/16/2021 Document Reviewed: 09/16/2021 Elsevier Patient Education  2024 [employer].

## 2023-08-10 ENCOUNTER — Ambulatory Visit: Payer: Medicaid Other | Admitting: Family Medicine

## 2023-08-10 ENCOUNTER — Other Ambulatory Visit (HOSPITAL_COMMUNITY)
Admission: RE | Admit: 2023-08-10 | Discharge: 2023-08-10 | Disposition: A | Discharge status: Home / Self Care | Payer: Medicaid Other | Source: Ambulatory Visit | Attending: Family Medicine | Admitting: Family Medicine

## 2023-08-10 ENCOUNTER — Other Ambulatory Visit: Payer: Self-pay

## 2023-08-10 ENCOUNTER — Encounter: Payer: Self-pay | Admitting: Family Medicine

## 2023-08-10 VITALS — BP 117/76 | HR 98 | Wt 156.8 lb

## 2023-08-10 DIAGNOSIS — O30042 Twin pregnancy, dichorionic/diamniotic, second trimester: Secondary | ICD-10-CM

## 2023-08-10 DIAGNOSIS — O0991 Supervision of high risk pregnancy, unspecified, first trimester: Secondary | ICD-10-CM

## 2023-08-10 DIAGNOSIS — F411 Generalized anxiety disorder: Secondary | ICD-10-CM | POA: Insufficient documentation

## 2023-08-10 DIAGNOSIS — O0990 Supervision of high risk pregnancy, unspecified, unspecified trimester: Secondary | ICD-10-CM

## 2023-08-10 DIAGNOSIS — F321 Major depressive disorder, single episode, moderate: Secondary | ICD-10-CM

## 2023-08-10 DIAGNOSIS — Z3A13 13 weeks gestation of pregnancy: Secondary | ICD-10-CM

## 2023-08-10 DIAGNOSIS — Z1332 Encounter for screening for maternal depression: Secondary | ICD-10-CM

## 2023-08-10 DIAGNOSIS — Z23 Encounter for immunization: Secondary | ICD-10-CM | POA: Diagnosis not present

## 2023-08-10 DIAGNOSIS — O30041 Twin pregnancy, dichorionic/diamniotic, first trimester: Secondary | ICD-10-CM

## 2023-08-10 HISTORY — DX: Generalized anxiety disorder: F41.1

## 2023-08-10 HISTORY — DX: Major depressive disorder, single episode, moderate: F32.1

## 2023-08-10 MED ORDER — ASPIRIN 81 MG PO CHEW: 162.0000 mg | CHEWABLE_TABLET | Freq: Every day | ORAL | 3 refills | Status: DC

## 2023-08-10 NOTE — Progress Notes (Signed)
 Subjective:   Traci Campbell is a 28 y.o. G1P0000 at [redacted]w[redacted]d by LMP, early ultrasound being seen today for her first obstetrical visit.  Her obstetrical history is significant for  h/o substance use and twins this pregnancy . Patient does intend to breast feed. Pregnancy history fully reviewed.  Patient reports nausea.  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/10/2023   Vaginal Pap smear, abnormal    Past Surgical History:  Procedure Laterality Date   COLPOSCOPY     Family History  Problem Relation Age of Onset   Hypertension Mother    Pulmonary embolism Mother    Hypertension Father    Colon cancer Maternal Uncle 40   Sickle cell anemia Half-Sister    Social History   Tobacco Use   Smoking status: Never   Smokeless tobacco: Never  Vaping Use   Vaping status: Never Used  Substance Use Topics   Alcohol use: Not Currently    Comment: 3 times a week   Drug use: Not Currently    Types: Cocaine    Comment: 08/07/23 brief episode 02/2023,not current   No Known Allergies Current Outpatient Medications on File Prior to Visit  Medication Sig Dispense Refill   Blood Pressure Monitoring (BLOOD PRESSURE KIT) DEVI 1 Device by Does not apply route as needed. 1 each 0   Prenatal Vit-Fe Fumarate-FA (PRENATAL VITAMINS PO) Take 1 tablet by mouth daily at 6 (six) AM.     promethazine (PHENERGAN) 25 MG tablet Take 1 tablet (25 mg total) by mouth every 6 (six) hours as needed for nausea or vomiting. 30 tablet 0   No current facility-administered medications on file prior to visit.     Exam   Vitals:   08/10/23 0938  BP: 117/76  Pulse: 98  Weight: 156 lb  12.8 oz (71.1 kg)      Uterus:     Pelvic Exam: Perineum: no hemorrhoids, normal perineum   Vulva: normal external genitalia, no lesions   Vagina:  normal mucosa, normal discharge   Cervix: no lesions and normal, pap smear done.    Adnexa: normal adnexa and no mass, fullness, tenderness   Bony Pelvis: average  System: General: well-developed, well-nourished female in no acute distress   Breast:  normal appearance, no masses or tenderness   Skin: normal coloration and turgor, no rashes   Neurologic: oriented, normal, negative, normal mood   Extremities: normal strength, tone, and muscle mass, ROM of all joints is normal   HEENT PERRLA, extraocular movement intact and sclera clear, anicteric   Mouth/Teeth mucous membranes moist, pharynx normal without lesions and dental hygiene good   Neck supple and no masses   Cardiovascular: regular rate and rhythm   Respiratory:  no respiratory distress, normal breath sounds   Abdomen: soft, non-tender; bowel sounds normal; no masses,  no organomegaly   Bedside u/s reveals IUP @ 13 weeks, demise of 2nd twin around 9 weeks.  Assessment:   Pregnancy: G1P0000 Patient Active Problem List   Diagnosis  Date Noted   Supervision of high risk pregnancy, antepartum 08/07/2023   Twin pregnancy, twins dichorionic and diamniotic 08/07/2023   Cocaine abuse (HCC) 07/24/2022   Alcohol abuse 07/24/2022   Substance induced mood disorder (HCC) 07/24/2022     Plan:  1. Supervision of high risk pregnancy, antepartum (Primary) New OB labs today - CBC/D/Plt+RPR+Rh+ABO+RubIgG... - PANORAMA PRENATAL TEST - HORIZON Basic Panel - HgB A1c - Cytology - PAP( [place]) - Culture, OB Urine - Flu vaccine trivalent PF, 6mos and older(Flulaval,Afluria,Fluarix,Fluzone)  2. Dichorionic diamniotic twin pregnancy in second trimester With subsequent loss of 1 twins - aspirin 81 MG chewable tablet; Chew 2 tablets (162 mg total) by mouth daily.  Dispense: 180 tablet;  Refill: 3 - Protein / creatinine ratio, urine - Comprehensive metabolic panel - TSH  3. [redacted] weeks gestation of pregnancy    Initial labs drawn. Continue prenatal vitamins. Genetic Screening discussed, NIPS: ordered. Ultrasound discussed; fetal anatomic survey: ordered. Problem list reviewed and updated.  Routine obstetric precautions reviewed. Return in 3 weeks (on 08/31/2023) for [facility].

## 2023-08-11 LAB — COMPREHENSIVE METABOLIC PANEL
ALT: 6 IU/L (ref 0–32)
AST: 13 IU/L (ref 0–40)
Albumin: 4.3 g/dL (ref 4.0–5.0)
Alkaline Phosphatase: 44 IU/L (ref 44–121)
BUN/Creatinine Ratio: 21 (ref 9–23)
BUN: 11 mg/dL (ref 6–20)
Bilirubin Total: 0.6 mg/dL (ref 0.0–1.2)
CO2: 20 mmol/L (ref 20–29)
Calcium: 9.1 mg/dL (ref 8.7–10.2)
Chloride: 99 mmol/L (ref 96–106)
Creatinine, Ser: 0.52 mg/dL — ABNORMAL LOW (ref 0.57–1.00)
Globulin, Total: 2.8 g/dL (ref 1.5–4.5)
Glucose: 68 mg/dL — ABNORMAL LOW (ref 70–99)
Potassium: 4 mmol/L (ref 3.5–5.2)
Sodium: 134 mmol/L (ref 134–144)
Total Protein: 7.1 g/dL (ref 6.0–8.5)
eGFR: 129 mL/min/{1.73_m2} (ref >59)

## 2023-08-11 LAB — CBC/D/PLT+RPR+RH+ABO+RUBIGG...
Antibody Screen: NEGATIVE
Basophils Absolute: 0 10*3/uL (ref 0.0–0.2)
Basos: 0 %
EOS (ABSOLUTE): 0.1 10*3/uL (ref 0.0–0.4)
Eos: 1 %
HCV Ab: NONREACTIVE
HIV Screen 4th Generation wRfx: NONREACTIVE
Hematocrit: 35.9 % (ref 34.0–46.6)
Hemoglobin: 12 g/dL (ref 11.1–15.9)
Hepatitis B Surface Ag: NEGATIVE
Immature Grans (Abs): 0.1 10*3/uL (ref 0.0–0.1)
Immature Granulocytes: 1 %
Lymphocytes Absolute: 2.2 10*3/uL (ref 0.7–3.1)
Lymphs: 22 %
MCH: 30.1 pg (ref 26.6–33.0)
MCHC: 33.4 g/dL (ref 31.5–35.7)
MCV: 90 fL (ref 79–97)
Monocytes Absolute: 0.7 10*3/uL (ref 0.1–0.9)
Monocytes: 7 %
Neutrophils Absolute: 7 10*3/uL (ref 1.4–7.0)
Neutrophils: 69 %
Platelets: 266 10*3/uL (ref 150–450)
RBC: 3.99 x10E6/uL (ref 3.77–5.28)
RDW: 12.2 % (ref 11.7–15.4)
RPR Ser Ql: NONREACTIVE
Rh Factor: POSITIVE
Rubella Antibodies, IGG: 1.95 {index} (ref >0.99)
WBC: 10 10*3/uL (ref 3.4–10.8)

## 2023-08-11 LAB — PROTEIN / CREATININE RATIO, URINE
Creatinine, Urine: 305 mg/dL
Protein, Ur: 59 mg/dL
Protein/Creat Ratio: 193 mg/g{creat} (ref 0–200)

## 2023-08-11 LAB — TSH: TSH: 1.1 u[IU]/mL (ref 0.450–4.500)

## 2023-08-11 LAB — HCV INTERPRETATION

## 2023-08-11 LAB — HEMOGLOBIN A1C
Est. average glucose Bld gHb Est-mCnc: 97 mg/dL
Hgb A1c MFr Bld: 5 % (ref 4.8–5.6)

## 2023-08-13 LAB — URINE CULTURE, OB REFLEX

## 2023-08-13 LAB — CULTURE, OB URINE

## 2023-08-14 ENCOUNTER — Encounter: Payer: Self-pay | Admitting: Family Medicine

## 2023-08-14 LAB — CYTOLOGY - PAP
Chlamydia: NEGATIVE
Comment: NEGATIVE
Comment: NEGATIVE
Comment: NEGATIVE
Comment: NEGATIVE
Comment: NEGATIVE
Comment: NORMAL
HPV 16: NEGATIVE
HPV 18 / 45: NEGATIVE
High risk HPV: POSITIVE — AB
Neisseria Gonorrhea: NEGATIVE
Trichomonas: NEGATIVE

## 2023-08-14 MED ORDER — CEFDINIR 300 MG PO CAPS: 300.0000 mg | ORAL_CAPSULE | Freq: Two times a day (BID) | ORAL | 0 refills | Status: AC

## 2023-08-14 NOTE — Addendum Note (Signed)
 Addended by: Reva Bores on: 08/14/2023 09:27 AM   Modules accepted: Orders

## 2023-08-15 ENCOUNTER — Encounter: Payer: Self-pay | Admitting: Family Medicine

## 2023-08-15 ENCOUNTER — Telehealth: Payer: Self-pay

## 2023-08-15 DIAGNOSIS — R87612 Low grade squamous intraepithelial lesion on cytologic smear of cervix (LGSIL): Secondary | ICD-10-CM | POA: Insufficient documentation

## 2023-08-15 NOTE — Telephone Encounter (Addendum)
 Called patient regarding pap smear results. Per physician, patient needs colposcopy. Notified patient that the front office will schedule patient. Patient voiced understanding.  Marcelino Duster, RN   ----- Message from Reva Bores sent at 08/15/2023  9:19 AM EST ----- Needs colpo

## 2023-08-21 LAB — HORIZON CUSTOM: REPORT SUMMARY: NEGATIVE

## 2023-08-21 LAB — PANORAMA PRENATAL TEST FULL PANEL:PANORAMA TEST PLUS 5 ADDITIONAL MICRODELETIONS

## 2023-08-23 ENCOUNTER — Encounter: Payer: Self-pay | Admitting: Family Medicine

## 2023-08-24 ENCOUNTER — Telehealth: Payer: Self-pay

## 2023-08-24 DIAGNOSIS — O0990 Supervision of high risk pregnancy, unspecified, unspecified trimester: Secondary | ICD-10-CM

## 2023-08-24 NOTE — Telephone Encounter (Addendum)
 Called pt to review atypical finding on Panorama consistent with vanishing twin pregnancy. Pt previously aware of vanishing twin pregnancy. Explained we do not have NIPS result for surviving twin. Offered genetic counseling with MFM to review additional genetic screening options. Pt declines at this time.

## 2023-09-05 ENCOUNTER — Ambulatory Visit: Payer: Medicaid Other | Admitting: Family Medicine

## 2023-09-05 ENCOUNTER — Other Ambulatory Visit: Payer: Self-pay

## 2023-09-05 VITALS — BP 106/74 | HR 112 | Wt 161.6 lb

## 2023-09-05 DIAGNOSIS — O30042 Twin pregnancy, dichorionic/diamniotic, second trimester: Secondary | ICD-10-CM

## 2023-09-05 DIAGNOSIS — R87612 Low grade squamous intraepithelial lesion on cytologic smear of cervix (LGSIL): Secondary | ICD-10-CM | POA: Diagnosis not present

## 2023-09-05 DIAGNOSIS — F1994 Other psychoactive substance use, unspecified with psychoactive substance-induced mood disorder: Secondary | ICD-10-CM

## 2023-09-05 DIAGNOSIS — O0992 Supervision of high risk pregnancy, unspecified, second trimester: Secondary | ICD-10-CM | POA: Diagnosis not present

## 2023-09-05 DIAGNOSIS — F1410 Cocaine abuse, uncomplicated: Secondary | ICD-10-CM

## 2023-09-05 DIAGNOSIS — O219 Vomiting of pregnancy, unspecified: Secondary | ICD-10-CM

## 2023-09-05 DIAGNOSIS — Z3A17 17 weeks gestation of pregnancy: Secondary | ICD-10-CM

## 2023-09-05 DIAGNOSIS — G43709 Chronic migraine without aura, not intractable, without status migrainosus: Secondary | ICD-10-CM

## 2023-09-05 DIAGNOSIS — O0990 Supervision of high risk pregnancy, unspecified, unspecified trimester: Secondary | ICD-10-CM

## 2023-09-05 MED ORDER — CYCLOBENZAPRINE HCL 10 MG PO TABS: 10.0000 mg | ORAL_TABLET | Freq: Three times a day (TID) | ORAL | 1 refills | Status: DC | PRN

## 2023-09-05 MED ORDER — ONDANSETRON 4 MG PO TBDP
4.0000 mg | ORAL_TABLET | Freq: Four times a day (QID) | ORAL | 1 refills | Status: DC | PRN
Start: 2023-09-05 — End: 2024-02-04

## 2023-09-05 NOTE — Progress Notes (Addendum)
   PRENATAL VISIT NOTE  Subjective:  Traci Campbell is a 28 y.o. G1P0000 at [redacted]w[redacted]d being seen today for ongoing prenatal care.  She is currently monitored for the following issues for this high-risk pregnancy and has Cocaine abuse (HCC); Alcohol abuse; Substance induced mood disorder (HCC); Supervision of high risk pregnancy, antepartum; Twin pregnancy, twins dichorionic and diamniotic; and LGSIL on Pap smear of cervix on their problem list.  Patient reports no complaints.  Contractions: Not present. Vag. Bleeding: None.  Movement: Absent. Denies leaking of fluid.   The following portions of the patient's history were reviewed and updated as appropriate: allergies, current medications, past family history, past medical history, past social history, past surgical history and problem list.   Objective:   Vitals:   09/05/23 1140  BP: 106/74  Pulse: (!) 112  Weight: 161 lb 9 oz (73.3 kg)    Fetal Status: Fetal Heart Rate (bpm): 139   Movement: Absent     General:  Alert, oriented and cooperative. Patient is in no acute distress.  Skin: Skin is warm and dry. No rash noted.   Cardiovascular: Normal heart rate noted  Respiratory: Normal respiratory effort, no problems with respiration noted  Abdomen: Soft, gravid, appropriate for gestational age.  Pain/Pressure: Absent     Pelvic: Cervical exam deferred        Extremities: Normal range of motion.  Edema: Trace  Mental Status: Normal mood and affect. Normal behavior. Normal judgment and thought content.   Colposcopy Patient gave informed written consent, time out was performed.  Placed in lithotomy position. Cervix viewed with speculum and colposcope after application of acetic acid. LSIL HPV positive, 16/18 negative  Colposcopy adequate? Yes  acetowhite lesion(s) noted at 12 and 5 o'clock; pt declined bx today- see image.      Chaperone was present during entire procedure.  Patient was given post procedure instructions.  Will follow up  pathology and manage accordingly; patient will be contacted with results and recommendations.  Routine preventative health maintenance measures emphasized.    Assessment and Plan:  Pregnancy: G1P0000 at [redacted]w[redacted]d 1. Supervision of high risk pregnancy, antepartum (Primary) Up to date Feeling flutters Has appt for US   2. Cocaine abuse (HCC) Positive 07/24/22  3. Dichorionic diamniotic twin pregnancy in second trimester Vanishing twin  4. LGSIL on Pap smear of cervix Colpo appears low risk Offered bx today patient declined  5. Substance induced mood disorder (HCC)   Preterm labor symptoms and general obstetric precautions including but not limited to vaginal bleeding, contractions, leaking of fluid and fetal movement were reviewed in detail with the patient. Please refer to After Visit Summary for other counseling recommendations.   No follow-ups on file.  Future Appointments  Date Time Provider Department Center  09/19/2023  9:00 AM [facility] NURSE [facility]  09/19/2023  9:15 AM [facility] PROVIDER 1 [facility]  09/19/2023  9:30 AM [facility] US4 [facility]  09/19/2023 11:00 AM [facility] PROVIDER 1 [facility]    Abner Ables, MD

## 2023-09-11 ENCOUNTER — Encounter: Payer: Self-pay | Admitting: Family Medicine

## 2023-09-11 ENCOUNTER — Encounter: Payer: Self-pay | Admitting: *Deleted

## 2023-09-17 ENCOUNTER — Ambulatory Visit: Attending: Maternal & Fetal Medicine | Admitting: *Deleted

## 2023-09-17 DIAGNOSIS — Z3A00 Weeks of gestation of pregnancy not specified: Secondary | ICD-10-CM

## 2023-09-17 DIAGNOSIS — O0990 Supervision of high risk pregnancy, unspecified, unspecified trimester: Secondary | ICD-10-CM

## 2023-09-17 NOTE — Progress Notes (Unsigned)
 Voicemail left for callback

## 2023-09-18 NOTE — Progress Notes (Signed)
 New OB Intake  I connected with Traci Campbell  on 09/18/23 at  8:00 AM EDT by phone and verified that I am speaking with the correct person using two identifiers. Nurse is located at [facility] and pt is located at home.   I explained I am completing New Patient Intake today. We discussed EDD of 02/13/2024, by Ultrasound. Pt is G1P0000. I reviewed her allergies, medications and Medical/Surgical/OB history.    Patient Active Problem List   Diagnosis Date Noted   LGSIL on Pap smear of cervix 08/15/2023   Supervision of high risk pregnancy, antepartum 08/07/2023   Twin pregnancy, twins dichorionic and diamniotic 08/07/2023   Cocaine abuse (HCC) 07/24/2022   Alcohol abuse 07/24/2022   Substance induced mood disorder (HCC) 07/24/2022    Patient advised of first appointment in our office and when to arrive.   All questions were answered.

## 2023-09-19 ENCOUNTER — Ambulatory Visit: Payer: Medicaid Other | Attending: Family Medicine

## 2023-09-19 ENCOUNTER — Ambulatory Visit: Payer: Medicaid Other

## 2023-09-19 ENCOUNTER — Ambulatory Visit: Admitting: Obstetrics and Gynecology

## 2023-09-19 ENCOUNTER — Other Ambulatory Visit: Payer: Self-pay | Admitting: *Deleted

## 2023-09-19 VITALS — BP 113/67 | HR 98

## 2023-09-19 DIAGNOSIS — O285 Abnormal chromosomal and genetic finding on antenatal screening of mother: Secondary | ICD-10-CM | POA: Diagnosis not present

## 2023-09-19 DIAGNOSIS — O3122X0 Continuing pregnancy after intrauterine death of one fetus or more, second trimester, not applicable or unspecified: Secondary | ICD-10-CM

## 2023-09-19 DIAGNOSIS — O3110X0 Continuing pregnancy after spontaneous abortion of one fetus or more, unspecified trimester, not applicable or unspecified: Secondary | ICD-10-CM

## 2023-09-19 DIAGNOSIS — O0990 Supervision of high risk pregnancy, unspecified, unspecified trimester: Secondary | ICD-10-CM | POA: Diagnosis present

## 2023-09-19 DIAGNOSIS — O30049 Twin pregnancy, dichorionic/diamniotic, unspecified trimester: Secondary | ICD-10-CM | POA: Insufficient documentation

## 2023-09-19 DIAGNOSIS — O280 Abnormal hematological finding on antenatal screening of mother: Secondary | ICD-10-CM

## 2023-09-19 DIAGNOSIS — Z3A19 19 weeks gestation of pregnancy: Secondary | ICD-10-CM | POA: Diagnosis present

## 2023-09-19 DIAGNOSIS — O30042 Twin pregnancy, dichorionic/diamniotic, second trimester: Secondary | ICD-10-CM

## 2023-09-19 DIAGNOSIS — Z362 Encounter for other antenatal screening follow-up: Secondary | ICD-10-CM

## 2023-09-19 NOTE — Progress Notes (Addendum)
  Maternal-Fetal Medicine Consultation Name: Traci Campbell ZOX:[id number]  G1 P0 at 19-weeks' gestation.  Patient is here for fetal anatomy scan.  She had ultrasound performed at [redacted] weeks gestation that confirmed a dichorionic-diamniotic twin pregnancy.  Subsequently, at [redacted] weeks gestation, pregnancy loss of 1 twin was seen. On cell-free fetal DNA screening, the results could not be reported possibly because of vanishing twin.  Patient reports no chronic medical conditions.  She had abnormal Pap smear and had colposcopy procedure, and the biopsy results are pending. Patient denies tobacco or drug or alcohol use.  Ultrasound We performed a fetal anatomical survey.  Amniotic fluid is normal and good fetal activity seen.  Fetal biometry is consistent with the previously established dates.  No markers of aneuploidies or obvious fetal structural defects are seen. On transabdominal scan, the cervix looks long and closed. Patient understands the limitations of ultrasound in detecting fetal anomalies.  "Vanishing Twin Syndrome" -It is the spontaneous loss of one fetus in a twin pregnancy and occurs in 10% to 30% of pregnancies conceived by assisted reproductive technologies (ART).  -Our patient had ultrasound at 7 weeks' gestation that showed fetal heart activities in both twins. She reports that loss of one twin was diagnosed at 44 weeks' gestation. -I reassured the patient that studies have not shown increased risk for neurological sequelae including cerebral palsy in the surviving twin. -Low-birth weight and preterm delivery are probably higher.  Vanishing Twin and Aneuploidy screening -Cell-free fetal DNA screening (NIPT) or nuchal-translucency screening is not recommended if "vanishing twin" is diagnosed. Abnormal chromosomal anomaly, if seen on NIPT, may have been from vanishing twin. -In a study involving 240 cases of VT, false positive cases (NIPT) was seen in about 2.6% of cases (versus singleton  0.3%). It showed a higher proportion of cases without false positive results if screened after 14 weeks. -I discussed the following options: 1) No repeat screening or 2) NIPT 16 weeks after the first result. If NIPT is positive for chromosomal anomaly, it should NOT be assumed that the abnormal result is from the VT. Amniocentesis is recommended if NIPT is positive. -I informed the patient that only amniocentesis will give a definitive result on the fetal karyotype.  I discussed the procedures and possible complication of miscarriage (1 and 500 procedures). Patient opted not to have amniocentesis.  Recommendations -An appointment was made for her to return in 5 weeks for completion of fetal anatomy. -Fetal growth assessment at [redacted] weeks gestation.   Consultation including face-to-face (more than 50%) counseling 30 minutes.

## 2023-10-03 ENCOUNTER — Encounter: Payer: Self-pay | Admitting: Family Medicine

## 2023-10-03 ENCOUNTER — Other Ambulatory Visit: Payer: Self-pay

## 2023-10-03 ENCOUNTER — Ambulatory Visit: Admitting: Family Medicine

## 2023-10-03 VITALS — BP 103/70 | HR 96 | Wt 163.0 lb

## 2023-10-03 DIAGNOSIS — F1994 Other psychoactive substance use, unspecified with psychoactive substance-induced mood disorder: Secondary | ICD-10-CM | POA: Diagnosis not present

## 2023-10-03 DIAGNOSIS — O0992 Supervision of high risk pregnancy, unspecified, second trimester: Secondary | ICD-10-CM

## 2023-10-03 DIAGNOSIS — R87612 Low grade squamous intraepithelial lesion on cytologic smear of cervix (LGSIL): Secondary | ICD-10-CM

## 2023-10-03 DIAGNOSIS — O30042 Twin pregnancy, dichorionic/diamniotic, second trimester: Secondary | ICD-10-CM | POA: Diagnosis not present

## 2023-10-03 DIAGNOSIS — F1010 Alcohol abuse, uncomplicated: Secondary | ICD-10-CM

## 2023-10-03 DIAGNOSIS — Z3A21 21 weeks gestation of pregnancy: Secondary | ICD-10-CM

## 2023-10-03 DIAGNOSIS — F1410 Cocaine abuse, uncomplicated: Secondary | ICD-10-CM | POA: Diagnosis not present

## 2023-10-03 DIAGNOSIS — O0990 Supervision of high risk pregnancy, unspecified, unspecified trimester: Secondary | ICD-10-CM

## 2023-10-03 DIAGNOSIS — K219 Gastro-esophageal reflux disease without esophagitis: Secondary | ICD-10-CM

## 2023-10-03 MED ORDER — CIMETIDINE 200 MG PO TABS: 200.0000 mg | ORAL_TABLET | Freq: Two times a day (BID) | ORAL | Status: DC

## 2023-10-03 NOTE — Patient Instructions (Signed)

## 2023-10-03 NOTE — Progress Notes (Signed)
   PRENATAL VISIT NOTE  Subjective:  Traci Campbell is a 28 y.o. G1P0000 at [redacted]w[redacted]d being seen today for ongoing prenatal care.  She is currently monitored for the following issues for this high-risk pregnancy and has Cocaine abuse (HCC); Substance induced mood disorder (HCC); Supervision of high risk pregnancy, antepartum; Twin pregnancy--Vanishing twin; and LGSIL on Pap smear of cervix on their problem list.  Patient reports no complaints.  Contractions: Not present. Vag. Bleeding: None.  Movement: Present. Denies leaking of fluid.   The following portions of the patient's history were reviewed and updated as appropriate: allergies, current medications, past family history, past medical history, past social history, past surgical history and problem list.   Objective:   Vitals:   10/03/23 0824  BP: 103/70  Pulse: 96  Weight: 163 lb (73.9 kg)    Fetal Status: Fetal Heart Rate (bpm): 157 Fundal Height: 21 cm Movement: Present     General:  Alert, oriented and cooperative. Patient is in no acute distress.  Skin: Skin is warm and dry. No rash noted.   Cardiovascular: Normal heart rate noted  Respiratory: Normal respiratory effort, no problems with respiration noted  Abdomen: Soft, gravid, appropriate for gestational age.  Pain/Pressure: Absent     Pelvic: Cervical exam deferred        Extremities: Normal range of motion.  Edema: Trace  Mental Status: Normal mood and affect. Normal behavior. Normal judgment and thought content.   Assessment and Plan:  Pregnancy: G1P0000 at [redacted]w[redacted]d 1. Supervision of high risk pregnancy, antepartum (Primary) Vigorous movement FH appropriate  Recommend discussing [facility] next visit-- there are openings for August EDD UTI TOC today  2. Substance induced mood disorder (HCC) Not applicable, this was in the past > 1 year prior  3. Dichorionic diamniotic twin pregnancy in second trimester Can collect Panorama after 6/12 (16 wk after previous collection, see MFM  note 4/2) Vanishing twin pregnancy, loss at ~9-10 weeks  4. Cocaine abuse (HCC) 07/24/22 UDS positive, no use since that time  5. Alcohol abuse NA, no currently   6. LGSIL on Pap smear of cervix Colpo with acetowhite changes, no bx performed per patient request  Needs colpo pp  Preterm labor symptoms and general obstetric precautions including but not limited to vaginal bleeding, contractions, leaking of fluid and fetal movement were reviewed in detail with the patient. Please refer to After Visit Summary for other counseling recommendations.   Return in about 4 weeks (around 10/31/2023) for Routine prenatal care.  Future Appointments  Date Time Provider Department Center  10/24/2023  8:00 AM [facility] PROVIDER 1 [facility]  10/24/2023  8:30 AM [facility] US4 [facility]  10/31/2023  8:15 AM Cresenzo, Mardee Shackle, MD [facility]    Abner Ables, MD

## 2023-10-05 LAB — CULTURE, OB URINE

## 2023-10-05 LAB — AFP, SERUM, OPEN SPINA BIFIDA
AFP MoM: 1.36
AFP Value: 91.7 ng/mL
Gest. Age on Collection Date: 21 wk
Maternal Age At EDD: 30 a
OSBR Risk 1 IN: 8068
Test Results:: NEGATIVE
Weight: 163 [lb_av]

## 2023-10-05 LAB — URINE CULTURE, OB REFLEX

## 2023-10-06 ENCOUNTER — Encounter: Payer: Self-pay | Admitting: Family Medicine

## 2023-10-24 ENCOUNTER — Ambulatory Visit (HOSPITAL_BASED_OUTPATIENT_CLINIC_OR_DEPARTMENT_OTHER): Admitting: Obstetrics

## 2023-10-24 ENCOUNTER — Ambulatory Visit: Attending: Obstetrics and Gynecology

## 2023-10-24 ENCOUNTER — Other Ambulatory Visit: Payer: Self-pay | Admitting: *Deleted

## 2023-10-24 DIAGNOSIS — O30042 Twin pregnancy, dichorionic/diamniotic, second trimester: Secondary | ICD-10-CM

## 2023-10-24 DIAGNOSIS — O285 Abnormal chromosomal and genetic finding on antenatal screening of mother: Secondary | ICD-10-CM

## 2023-10-24 DIAGNOSIS — O3110X0 Continuing pregnancy after spontaneous abortion of one fetus or more, unspecified trimester, not applicable or unspecified: Secondary | ICD-10-CM | POA: Diagnosis present

## 2023-10-24 DIAGNOSIS — O280 Abnormal hematological finding on antenatal screening of mother: Secondary | ICD-10-CM

## 2023-10-24 DIAGNOSIS — Z3A24 24 weeks gestation of pregnancy: Secondary | ICD-10-CM

## 2023-10-24 DIAGNOSIS — Z362 Encounter for other antenatal screening follow-up: Secondary | ICD-10-CM

## 2023-10-24 NOTE — Progress Notes (Signed)
 MFM Consult Note  Traci Campbell is currently at 24 weeks and 0 days.  She has been followed as her cell free DNA test indicated atypical findings and is suspected to be a possible vanishing twin.   The views of the fetal anatomy were unable to be fully visualized during her last exam.  She denies any problems since her last exam.  On today's exam, the overall EFW of 1 pound 7 ounces measures at the 46 percentile for her gestational age.  There was normal amniotic fluid noted.  The views of the fetal anatomy were visualized today.  There were no obvious anomalies noted.    The limitations of ultrasound in the detection of all anomalies was discussed.    On today's exam, an anterior and posterior placenta were noted.  It was difficult to determine if there are 2 separate placentas or if there is a connection between the 2 placentas on the lateral side of her uterus.  Only one fetus is noted.  There were no signs of vasa previa noted.  We will continue to assess the placentas during her future ultrasound exams.  Care should be taken to ensure that all placental tissue is removed at the time of delivery.  Due to her abnormal cell free DNA test, we will continue to follow her with growth ultrasounds throughout her pregnancy.    A follow-up exam was scheduled in 6 weeks.    The patient stated that all of her questions were answered today.  A total of 20 minutes was spent counseling and coordinating the care for this patient.  Greater than 50% of the time was spent in direct face-to-face contact.

## 2023-10-31 ENCOUNTER — Ambulatory Visit (INDEPENDENT_AMBULATORY_CARE_PROVIDER_SITE_OTHER): Admitting: Family Medicine

## 2023-10-31 ENCOUNTER — Other Ambulatory Visit: Payer: Self-pay

## 2023-10-31 VITALS — BP 107/71 | HR 121 | Wt 169.0 lb

## 2023-10-31 DIAGNOSIS — R87612 Low grade squamous intraepithelial lesion on cytologic smear of cervix (LGSIL): Secondary | ICD-10-CM | POA: Diagnosis not present

## 2023-10-31 DIAGNOSIS — O280 Abnormal hematological finding on antenatal screening of mother: Secondary | ICD-10-CM

## 2023-10-31 DIAGNOSIS — O30042 Twin pregnancy, dichorionic/diamniotic, second trimester: Secondary | ICD-10-CM

## 2023-10-31 DIAGNOSIS — Z3A25 25 weeks gestation of pregnancy: Secondary | ICD-10-CM

## 2023-10-31 DIAGNOSIS — F1410 Cocaine abuse, uncomplicated: Secondary | ICD-10-CM

## 2023-10-31 DIAGNOSIS — O0992 Supervision of high risk pregnancy, unspecified, second trimester: Secondary | ICD-10-CM

## 2023-10-31 DIAGNOSIS — O0990 Supervision of high risk pregnancy, unspecified, unspecified trimester: Secondary | ICD-10-CM

## 2023-10-31 NOTE — Progress Notes (Signed)
   PRENATAL VISIT NOTE  Subjective:  Traci Campbell is a 28 y.o. G1P0000 at [redacted]w[redacted]d being seen today for ongoing prenatal care.  She is currently monitored for the following issues for this high-risk pregnancy and has Cocaine abuse (HCC); Substance induced mood disorder (HCC); Supervision of high risk pregnancy, antepartum; Twin pregnancy--Vanishing twin; and LGSIL on Pap smear of cervix on their problem list.  Patient reports no bleeding, no contractions, no cramping, and no leaking.  Contractions: Not present. Vag. Bleeding: None.  Movement: Present. Denies leaking of fluid.   The following portions of the patient's history were reviewed and updated as appropriate: allergies, current medications, past family history, past medical history, past social history, past surgical history and problem list.   Objective:    Vitals:   10/31/23 0827  BP: 107/71  Pulse: (!) 121  Weight: 169 lb (76.7 kg)    Fetal Status:  Fetal Heart Rate (bpm): 140   Movement: Present    General: Alert, oriented and cooperative. Patient is in no acute distress.  Skin: Skin is warm and dry. No rash noted.   Cardiovascular: Normal heart rate noted  Respiratory: Normal respiratory effort, no problems with respiration noted  Abdomen: Soft, gravid, appropriate for gestational age.  Pain/Pressure: Absent     Pelvic: Cervical exam deferred        Extremities: Normal range of motion.  Edema: None  Mental Status: Normal mood and affect. Normal behavior. Normal judgment and thought content.   Assessment and Plan:  Pregnancy: G1P0000 at [redacted]w[redacted]d 1. Supervision of high risk pregnancy, antepartum (Primary) FHR and BP appropriate today Continue routine prenatal care Patient is an interested and mom baby combined care clinic so will be added to the list  2. Dichorionic diamniotic twin pregnancy in second trimester Patient will need panorama after 11/29/2023.  This will be 16 weeks after previous collection. Vanishing twin  pregnancy, alongside approximately 9 or 10 weeks - US  MFM OB FOLLOW UP  3. LGSIL on Pap smear of cervix Initial colposcopy showing acetowhite changes but no biopsy was done.  Needs colposcopy postpartum  4. Cocaine abuse (HCC) Had a positive UDS on 07/24/2022 but has not used since that time.  5. Abnormal maternal serum screening test - US  MFM OB FOLLOW UP  6. [redacted] weeks gestation of pregnancy  Preterm labor symptoms and general obstetric precautions including but not limited to vaginal bleeding, contractions, leaking of fluid and fetal movement were reviewed in detail with the patient. Please refer to After Visit Summary for other counseling recommendations.   No follow-ups on file.  Future Appointments  Date Time Provider Department Center  12/05/2023  8:00 AM [facility] PROVIDER 1 [facility]  12/05/2023  8:30 AM [facility] US2 [facility]  12/07/2023  8:20 AM [facility] LAB [facility]  12/07/2023  8:55 AM Ilona Malta, Lorinda Root, MD [facility]    Ferdie Housekeeper, MD

## 2023-11-01 ENCOUNTER — Other Ambulatory Visit: Payer: Self-pay | Admitting: *Deleted

## 2023-11-01 DIAGNOSIS — O280 Abnormal hematological finding on antenatal screening of mother: Secondary | ICD-10-CM

## 2023-11-01 DIAGNOSIS — O30049 Twin pregnancy, dichorionic/diamniotic, unspecified trimester: Secondary | ICD-10-CM

## 2023-11-04 ENCOUNTER — Encounter: Payer: Self-pay | Admitting: Family Medicine

## 2023-11-14 ENCOUNTER — Other Ambulatory Visit: Payer: Self-pay

## 2023-11-14 DIAGNOSIS — Z3A27 27 weeks gestation of pregnancy: Secondary | ICD-10-CM

## 2023-11-20 ENCOUNTER — Other Ambulatory Visit: Payer: Self-pay

## 2023-11-20 ENCOUNTER — Ambulatory Visit: Payer: Self-pay | Admitting: Obstetrics & Gynecology

## 2023-11-20 VITALS — BP 107/72 | HR 108 | Wt 170.0 lb

## 2023-11-20 DIAGNOSIS — Z3A27 27 weeks gestation of pregnancy: Secondary | ICD-10-CM

## 2023-11-20 DIAGNOSIS — O0990 Supervision of high risk pregnancy, unspecified, unspecified trimester: Secondary | ICD-10-CM

## 2023-11-20 DIAGNOSIS — O30042 Twin pregnancy, dichorionic/diamniotic, second trimester: Secondary | ICD-10-CM

## 2023-11-20 NOTE — Progress Notes (Signed)
   PRENATAL VISIT NOTE  Subjective:  Traci Campbell is a 28 y.o. G1P0000 at [redacted]w[redacted]d being seen today for ongoing prenatal care.  She is currently monitored for the following issues for this low-risk pregnancy and has Supervision of high risk pregnancy, antepartum; Twin pregnancy--Vanishing twin; Major depressive disorder, single episode, moderate (HCC); and LGSIL on Pap smear of cervix on their problem list.  Patient reports no complaints.  Contractions: Not present. Vag. Bleeding: None.  Movement: Present. Denies leaking of fluid.   The following portions of the patient's history were reviewed and updated as appropriate: allergies, current medications, past family history, past medical history, past social history, past surgical history and problem list.   Objective:    Vitals:   11/20/23 1042  BP: 107/72  Pulse: (!) 108  Weight: 170 lb (77.1 kg)    Fetal Status:  Fetal Heart Rate (bpm): 140   Movement: Present    General: Alert, oriented and cooperative. Patient is in no acute distress.  Skin: Skin is warm and dry. No rash noted.   Cardiovascular: Normal heart rate noted  Respiratory: Normal respiratory effort, no problems with respiration noted  Abdomen: Soft, gravid, appropriate for gestational age.  Pain/Pressure: Absent     Pelvic: Cervical exam deferred        Extremities: Normal range of motion.  Edema: Trace  Mental Status: Normal mood and affect. Normal behavior. Normal judgment and thought content.   Assessment and Plan:  Pregnancy: G1P0000 at [redacted]w[redacted]d 1. Dichorionic diamniotic twin pregnancy in second trimester (Primary) Vanishing twin, f/u US  in 2 weeks  2. Supervision of high risk pregnancy, antepartum  Preterm labor symptoms and general obstetric precautions including but not limited to vaginal bleeding, contractions, leaking of fluid and fetal movement were reviewed in detail with the patient. Please refer to After Visit Summary for other counseling recommendations.    Return in about 4 weeks (around 12/18/2023).  Future Appointments  Date Time Provider Department Center  12/05/2023  8:00 AM [facility] PROVIDER 1 [facility]  12/05/2023  8:30 AM [facility] US2 [facility]  12/17/2023 10:55 AM Tresia Fruit, MD [facility]    Onnie Bilis, MD

## 2023-11-21 ENCOUNTER — Encounter: Payer: Self-pay | Admitting: Obstetrics & Gynecology

## 2023-11-21 ENCOUNTER — Encounter: Payer: Self-pay | Admitting: Family Medicine

## 2023-11-21 LAB — CBC
Hematocrit: 29.7 % — ABNORMAL LOW (ref 34.0–46.6)
Hemoglobin: 9.9 g/dL — ABNORMAL LOW (ref 11.1–15.9)
MCH: 30 pg (ref 26.6–33.0)
MCHC: 33.3 g/dL (ref 31.5–35.7)
MCV: 90 fL (ref 79–97)
Platelets: 233 10*3/uL (ref 150–450)
RBC: 3.3 x10E6/uL — ABNORMAL LOW (ref 3.77–5.28)
RDW: 13.3 % (ref 11.7–15.4)
WBC: 10.3 10*3/uL (ref 3.4–10.8)

## 2023-11-21 LAB — GLUCOSE TOLERANCE, 2 HOURS W/ 1HR
Glucose, 1 hour: 154 mg/dL (ref 70–179)
Glucose, 2 hour: 128 mg/dL (ref 70–152)
Glucose, Fasting: 71 mg/dL (ref 70–91)

## 2023-11-21 LAB — HIV ANTIBODY (ROUTINE TESTING W REFLEX): HIV Screen 4th Generation wRfx: NONREACTIVE

## 2023-11-21 LAB — RPR: RPR Ser Ql: NONREACTIVE

## 2023-11-26 ENCOUNTER — Other Ambulatory Visit

## 2023-11-26 ENCOUNTER — Encounter: Admitting: Family Medicine

## 2023-12-04 DIAGNOSIS — Z0289 Encounter for other administrative examinations: Secondary | ICD-10-CM

## 2023-12-05 ENCOUNTER — Ambulatory Visit: Payer: Self-pay | Attending: Obstetrics | Admitting: Maternal & Fetal Medicine

## 2023-12-05 ENCOUNTER — Ambulatory Visit

## 2023-12-05 ENCOUNTER — Other Ambulatory Visit: Payer: Self-pay | Admitting: *Deleted

## 2023-12-05 ENCOUNTER — Encounter: Payer: Self-pay | Admitting: Obstetrics & Gynecology

## 2023-12-05 DIAGNOSIS — Z3A30 30 weeks gestation of pregnancy: Secondary | ICD-10-CM

## 2023-12-05 DIAGNOSIS — O3110X0 Continuing pregnancy after spontaneous abortion of one fetus or more, unspecified trimester, not applicable or unspecified: Secondary | ICD-10-CM

## 2023-12-05 DIAGNOSIS — Z362 Encounter for other antenatal screening follow-up: Secondary | ICD-10-CM | POA: Insufficient documentation

## 2023-12-05 DIAGNOSIS — O280 Abnormal hematological finding on antenatal screening of mother: Secondary | ICD-10-CM

## 2023-12-05 DIAGNOSIS — O285 Abnormal chromosomal and genetic finding on antenatal screening of mother: Secondary | ICD-10-CM | POA: Insufficient documentation

## 2023-12-05 DIAGNOSIS — O43103 Malformation of placenta, unspecified, third trimester: Secondary | ICD-10-CM | POA: Diagnosis not present

## 2023-12-05 DIAGNOSIS — O3113X0 Continuing pregnancy after spontaneous abortion of one fetus or more, third trimester, not applicable or unspecified: Secondary | ICD-10-CM

## 2023-12-05 DIAGNOSIS — O30043 Twin pregnancy, dichorionic/diamniotic, third trimester: Secondary | ICD-10-CM

## 2023-12-05 DIAGNOSIS — O30049 Twin pregnancy, dichorionic/diamniotic, unspecified trimester: Secondary | ICD-10-CM

## 2023-12-05 DIAGNOSIS — O0990 Supervision of high risk pregnancy, unspecified, unspecified trimester: Secondary | ICD-10-CM

## 2023-12-05 NOTE — Progress Notes (Signed)
 Patient information  Patient Name: Traci Campbell  Patient MRN:   [id number]  Referring practice: MFM Referring Provider: [facility]  Problem List   Patient Active Problem List   Diagnosis Date Noted   Placenta succenturiate lobe affecting fetus 12/05/2023   LGSIL on Pap smear of cervix 08/15/2023   Major depressive disorder, single episode, moderate (HCC) 08/10/2023   Supervision of high risk pregnancy, antepartum 08/07/2023   Twin pregnancy--Vanishing twin 08/07/2023   Maternal Fetal medicine Consult  Traci Campbell is a 28 y.o. G1P0000 at [redacted]w[redacted]d here for ultrasound and consultation. Traci Campbell is doing well today with no acute concerns. Today we focused on the following:   Succenturiate lobe A succenturiate lobe was seen on today's ultrasound.  The accessory lobe is near the fundus and away from the cervix.  There is no evidence of vasa previa.  This describes a small accessory lobe of placenta that is attached to the main placenta by umbilical blood vessels. It occurs in 1:135 singleton pregnancies and in 1.5% of twin gestation.The exact pathogenesis is unknown but may relate to involution of cotyledons that formerly connected the two placentas. On ultrasound this appears as two distinct placental masses.  The connecting umbilical vessels can be identified by color Doppler.  The connecting vessels across the cervical arteries this is considered a vasa previa. In singleton pregnancies, the incidence of placental complications such as placental abruption, vasa previa, and retained placenta were observed to be associated with the presence of abnormally shaped placentas. There is an increased risk of retained succenturiate lobe and associated postpartum hemorrhage. Therefore, the delivering physician should ensure complete removal of the succenturiate lobe at the time of birth.   The patient had time to ask questions that were answered to her satisfaction.  She verbalized  understanding and agrees to proceed with the plan below.  Sonographic findings Single intrauterine pregnancy at 30w 0d.  Fetal cardiac activity:  Observed and appears normal. Presentation: Cephalic. Interval fetal anatomy appears normal. Fetal biometry shows the estimated fetal weight at the 28 percentile. Amniotic fluid volume: Within normal limits. MVP: 4.89 cm. Placenta: Succenturiate lobe.  There are limitations of prenatal ultrasound such as the inability to detect certain abnormalities due to poor visualization. Various factors such as fetal position, gestational age and maternal body habitus may increase the difficulty in visualizing the fetal anatomy.     Recommendations - The delivering physician should ensure complete removal of the succenturiate lobe at the time of birth.  - F/u growth US  in 7 weeks  Review of Systems: A review of systems was performed and was negative except per HPI   Vitals and Physical Exam    11/20/2023   10:42 AM 10/31/2023    8:27 AM 10/03/2023    8:24 AM  Vitals with BMI  Weight 170 lbs 169 lbs 163 lbs  Systolic 107 107 657  Diastolic 72 71 70  Pulse 108 121 96    Sitting comfortably on the sonogram table Nonlabored breathing Normal rate and rhythm Abdomen is nontender  Past pregnancies OB History  Gravida Para Term Preterm AB Living  1 0 0 0 0 0  SAB IAB Ectopic Multiple Live Births  0 0 0 0 0    # Outcome Date GA Lbr Len/2nd Weight Sex Type Anes PTL Lv  1 Current              I spent 30 minutes reviewing the patients chart, including  labs and images as well as counseling the patient about her medical conditions. Greater than 50% of the time was spent in direct face-to-face patient counseling.  Penney Bowling  MFM, [facility]   12/05/2023  10:53 AM

## 2023-12-07 ENCOUNTER — Other Ambulatory Visit

## 2023-12-07 ENCOUNTER — Encounter: Payer: Self-pay | Admitting: Family Medicine

## 2023-12-17 ENCOUNTER — Other Ambulatory Visit: Payer: Self-pay

## 2023-12-17 ENCOUNTER — Ambulatory Visit: Payer: Self-pay | Admitting: Family Medicine

## 2023-12-17 ENCOUNTER — Encounter: Admitting: Obstetrics & Gynecology

## 2023-12-17 VITALS — BP 103/69 | HR 104 | Wt 174.0 lb

## 2023-12-17 DIAGNOSIS — O30042 Twin pregnancy, dichorionic/diamniotic, second trimester: Secondary | ICD-10-CM | POA: Diagnosis not present

## 2023-12-17 DIAGNOSIS — Z3A31 31 weeks gestation of pregnancy: Secondary | ICD-10-CM | POA: Diagnosis not present

## 2023-12-17 DIAGNOSIS — R87612 Low grade squamous intraepithelial lesion on cytologic smear of cervix (LGSIL): Secondary | ICD-10-CM | POA: Diagnosis not present

## 2023-12-17 DIAGNOSIS — O280 Abnormal hematological finding on antenatal screening of mother: Secondary | ICD-10-CM

## 2023-12-17 DIAGNOSIS — Z23 Encounter for immunization: Secondary | ICD-10-CM | POA: Diagnosis not present

## 2023-12-17 DIAGNOSIS — O0990 Supervision of high risk pregnancy, unspecified, unspecified trimester: Secondary | ICD-10-CM

## 2023-12-17 MED ORDER — FERROUS SULFATE 325 (65 FE) MG PO TBEC: 325.0000 mg | DELAYED_RELEASE_TABLET | ORAL | 2 refills | Status: AC

## 2023-12-18 ENCOUNTER — Encounter: Payer: Self-pay | Admitting: Family Medicine

## 2023-12-18 NOTE — Telephone Encounter (Signed)
 Can you write her note for her in her mychart. She is not able to pick it up in the office.

## 2023-12-19 ENCOUNTER — Encounter: Payer: Self-pay | Admitting: Family Medicine

## 2023-12-19 NOTE — Progress Notes (Signed)
   PRENATAL VISIT NOTE  Subjective:  Traci Campbell is a 28 y.o. G1P0000 at [redacted]w[redacted]d being seen today for ongoing prenatal care.  She is currently monitored for the following issues for this high-risk pregnancy and has Supervision of high risk pregnancy, antepartum; Twin pregnancy--Vanishing twin; Major depressive disorder, single episode, moderate (HCC); LGSIL on Pap smear of cervix; and Placenta succenturiate lobe affecting fetus on their problem list.  Patient reports no bleeding, no contractions, no cramping, and no leaking.  Contractions: Not present. Vag. Bleeding: None.  Movement: Present. Denies leaking of fluid.   The following portions of the patient's history were reviewed and updated as appropriate: allergies, current medications, past family history, past medical history, past social history, past surgical history and problem list.   Objective:    Vitals:   12/17/23 1606  BP: 103/69  Pulse: (!) 104  Weight: 174 lb (78.9 kg)    Fetal Status:  Fetal Heart Rate (bpm): 145   Movement: Present    General: Alert, oriented and cooperative. Patient is in no acute distress.  Skin: Skin is warm and dry. No rash noted.   Cardiovascular: Normal heart rate noted  Respiratory: Normal respiratory effort, no problems with respiration noted  Abdomen: Soft, gravid, appropriate for gestational age.  Pain/Pressure: Absent     Pelvic: Cervical exam deferred        Extremities: Normal range of motion.  Edema: None  Mental Status: Normal mood and affect. Normal behavior. Normal judgment and thought content.   Assessment and Plan:  Pregnancy: G1P0000 at [redacted]w[redacted]d 1. Supervision of high risk pregnancy, antepartum FHR and BP appropriate today Mild anemia on 28-week labs discussed every other day iron, sent to patient's pharmacy Continue routine prenatal care  2. LGSIL on Pap smear of cervix Will need postpartum colposcopy  3. Abnormal maternal serum screening test  4. Dichorionic diamniotic twin  pregnancy in second trimester Vanishing twin pregnancy  5. [redacted] weeks gestation of pregnancy (Primary) - Tdap vaccine greater than or equal to 7yo IM  Preterm labor symptoms and general obstetric precautions including but not limited to vaginal bleeding, contractions, leaking of fluid and fetal movement were reviewed in detail with the patient. Please refer to After Visit Summary for other counseling recommendations.   No follow-ups on file.  Future Appointments  Date Time Provider Department Center  12/31/2023  9:35 AM Traci Suzen Octave, MD [facility]  01/14/2024 10:35 AM Traci Norleen GAILS, MD [facility]  01/21/2024 10:35 AM Traci Norleen GAILS, MD [facility]  01/23/2024  8:00 AM [facility] PROVIDER 1 [facility]  01/23/2024  8:30 AM [facility] US2 [facility]  01/28/2024  1:35 PM Traci Suzen Octave, MD [facility]  02/04/2024  8:55 AM Traci Suzen Octave, MD [facility]  02/11/2024  1:35 PM Traci Suzen Octave, MD [facility]    Norleen Campbell Ilean, MD

## 2023-12-31 ENCOUNTER — Ambulatory Visit (INDEPENDENT_AMBULATORY_CARE_PROVIDER_SITE_OTHER): Admitting: Family Medicine

## 2023-12-31 ENCOUNTER — Other Ambulatory Visit: Payer: Self-pay

## 2023-12-31 VITALS — BP 104/72 | HR 103 | Wt 177.5 lb

## 2023-12-31 DIAGNOSIS — O0990 Supervision of high risk pregnancy, unspecified, unspecified trimester: Secondary | ICD-10-CM

## 2023-12-31 DIAGNOSIS — O30043 Twin pregnancy, dichorionic/diamniotic, third trimester: Secondary | ICD-10-CM

## 2023-12-31 DIAGNOSIS — O43193 Other malformation of placenta, third trimester: Secondary | ICD-10-CM

## 2023-12-31 DIAGNOSIS — Z3A33 33 weeks gestation of pregnancy: Secondary | ICD-10-CM

## 2023-12-31 DIAGNOSIS — O30042 Twin pregnancy, dichorionic/diamniotic, second trimester: Secondary | ICD-10-CM

## 2023-12-31 NOTE — Progress Notes (Signed)
   PRENATAL VISIT NOTE  Subjective:  Traci Campbell is a 28 y.o. G1P0000 at [redacted]w[redacted]d being seen today for ongoing prenatal care.  She is currently monitored for the following issues for this low-risk pregnancy and has Supervision of high risk pregnancy, antepartum; Twin pregnancy--Vanishing twin; Major depressive disorder, single episode, moderate (HCC); LGSIL on Pap smear of cervix; and Placenta succenturiate lobe affecting fetus on their problem list.  Patient reports no complaints.  Contractions: Irritability. Vag. Bleeding: None.  Movement: Present. Denies leaking of fluid.   The following portions of the patient's history were reviewed and updated as appropriate: allergies, current medications, past family history, past medical history, past social history, past surgical history and problem list.   Objective:    Vitals:   12/31/23 0944  BP: 104/72  Pulse: (!) 103  Weight: 177 lb 8 oz (80.5 kg)    Fetal Status:  Fetal Heart Rate (bpm): 150   Movement: Present    General: Alert, oriented and cooperative. Patient is in no acute distress.  Skin: Skin is warm and dry. No rash noted.   Cardiovascular: Normal heart rate noted  Respiratory: Normal respiratory effort, no problems with respiration noted  Abdomen: Soft, gravid, appropriate for gestational age.  Pain/Pressure: Absent     Pelvic: Cervical exam deferred        Extremities: Normal range of motion.  Edema: Trace  Mental Status: Normal mood and affect. Normal behavior. Normal judgment and thought content.   Assessment and Plan:  Pregnancy: G1P0000 at [redacted]w[redacted]d  1. Supervision of high risk pregnancy, antepartum (Primary) Up to date  FH appropriate Vigorous movement Concerns today: Wants to know when she can be out of work reviewed 37+weeks, patient would like to be out at 39 weeks and has paperwork from her work. Encouraged to submit and reviewed 10 business day   2. Dichorionic diamniotic twin pregnancy in second trimester -  vanishing twin, now like singleton pregnancy  3. Placenta succenturiate lobe affecting fetus Keep this in mind at delivery, inspect/sweep uterus. There is concern for both Anterior and posterior placenta vs left lateral connection  4. [redacted] weeks gestation of pregnancy   Preterm labor symptoms and general obstetric precautions including but not limited to vaginal bleeding, contractions, leaking of fluid and fetal movement were reviewed in detail with the patient. Please refer to After Visit Summary for other counseling recommendations.   Return in about 2 weeks (around 01/14/2024) for Routine prenatal care, Mom+Baby Combined Care.  Future Appointments  Date Time Provider Department Center  01/14/2024 10:35 AM Traci Norleen GAILS, MD [facility]  01/21/2024 10:35 AM Traci Norleen GAILS, MD [facility]  01/23/2024  8:00 AM [facility] PROVIDER 1 [facility]  01/23/2024  8:30 AM [facility] US2 [facility]  01/28/2024  1:35 PM Traci Traci Octave, MD [facility]  02/04/2024  8:55 AM Traci Traci Octave, MD [facility]  02/11/2024  1:35 PM Traci Traci Octave, MD [facility]  02/20/2024 10:35 AM Traci Traci Octave, MD [facility]    Traci Campbell Eldonna, MD

## 2024-01-03 ENCOUNTER — Encounter: Admitting: Obstetrics and Gynecology

## 2024-01-08 DIAGNOSIS — Z0289 Encounter for other administrative examinations: Secondary | ICD-10-CM

## 2024-01-14 ENCOUNTER — Other Ambulatory Visit: Payer: Self-pay

## 2024-01-14 ENCOUNTER — Ambulatory Visit: Admitting: Family Medicine

## 2024-01-14 VITALS — BP 105/70 | HR 108 | Wt 179.5 lb

## 2024-01-14 DIAGNOSIS — O0993 Supervision of high risk pregnancy, unspecified, third trimester: Secondary | ICD-10-CM

## 2024-01-14 DIAGNOSIS — Z3A35 35 weeks gestation of pregnancy: Secondary | ICD-10-CM

## 2024-01-14 DIAGNOSIS — R87612 Low grade squamous intraepithelial lesion on cytologic smear of cervix (LGSIL): Secondary | ICD-10-CM

## 2024-01-14 DIAGNOSIS — O30042 Twin pregnancy, dichorionic/diamniotic, second trimester: Secondary | ICD-10-CM

## 2024-01-14 DIAGNOSIS — O0990 Supervision of high risk pregnancy, unspecified, unspecified trimester: Secondary | ICD-10-CM

## 2024-01-14 DIAGNOSIS — O30043 Twin pregnancy, dichorionic/diamniotic, third trimester: Secondary | ICD-10-CM

## 2024-01-14 NOTE — Progress Notes (Signed)
   PRENATAL VISIT NOTE  Subjective:  Traci Campbell is a 28 y.o. G1P0000 at [redacted]w[redacted]d being seen today for ongoing prenatal care.  She is currently monitored for the following issues for this low-risk pregnancy and has Supervision of high risk pregnancy, antepartum; Twin pregnancy--Vanishing twin; Major depressive disorder, single episode, moderate (HCC); LGSIL on Pap smear of cervix; and Placenta succenturiate lobe affecting fetus on their problem list.  Patient reports no bleeding, no contractions, no cramping, and no leaking.  Contractions: Irritability. Vag. Bleeding: None.  Movement: Present. Denies leaking of fluid.   The following portions of the patient's history were reviewed and updated as appropriate: allergies, current medications, past family history, past medical history, past social history, past surgical history and problem list.   Objective:    Vitals:   01/14/24 1044  BP: 105/70  Pulse: (!) 108  Weight: 179 lb 8 oz (81.4 kg)    Fetal Status:  Fetal Heart Rate (bpm): 145   Movement: Present    General: Alert, oriented and cooperative. Patient is in no acute distress.  Skin: Skin is warm and dry. No rash noted.   Cardiovascular: Normal heart rate noted  Respiratory: Normal respiratory effort, no problems with respiration noted  Abdomen: Soft, gravid, appropriate for gestational age.  Pain/Pressure: Present     Pelvic: Cervical exam deferred        Extremities: Normal range of motion.  Edema: Trace  Mental Status: Normal mood and affect. Normal behavior. Normal judgment and thought content.   Assessment and Plan:  Pregnancy: G1P0000 at [redacted]w[redacted]d 1. Supervision of high risk pregnancy, antepartum (Primary) FHR BP appropriate today Patient reported that she submitted her FMLA paperwork and is requesting to be out around 38 weeks  2. Placenta succenturiate lobe affecting fetus Please note at delivery, inspection sweep uterus.  There is concern for anterior and posterior  placenta with left lateral connection  3. Dichorionic diamniotic twin pregnancy in second trimester Vanishing twin, now singleton pregnancy  4. LGSIL on Pap smear of cervix Needs colposcopy postpartum  5. [redacted] weeks gestation of pregnancy   Preterm labor symptoms and general obstetric precautions including but not limited to vaginal bleeding, contractions, leaking of fluid and fetal movement were reviewed in detail with the patient. Please refer to After Visit Summary for other counseling recommendations.   No follow-ups on file.  Future Appointments  Date Time Provider Department Center  01/21/2024 10:35 AM Ilean Norleen GAILS, MD [facility]  01/23/2024  8:00 AM [facility] PROVIDER 1 [facility]  01/23/2024  8:30 AM [facility] US2 [facility]  01/28/2024  1:35 PM Eldonna Suzen Octave, MD [facility]  02/04/2024  8:55 AM Eldonna Suzen Octave, MD [facility]  02/11/2024  1:35 PM Eldonna Suzen Octave, MD [facility]  02/20/2024 10:35 AM Eldonna Suzen Octave, MD [facility]    Norleen GAILS Ilean, MD

## 2024-01-17 ENCOUNTER — Encounter: Payer: Self-pay | Admitting: Obstetrics & Gynecology

## 2024-01-21 ENCOUNTER — Other Ambulatory Visit (HOSPITAL_COMMUNITY)
Admission: RE | Admit: 2024-01-21 | Discharge: 2024-01-21 | Disposition: A | Discharge status: Home / Self Care | Source: Ambulatory Visit | Attending: Family Medicine | Admitting: Family Medicine

## 2024-01-21 ENCOUNTER — Ambulatory Visit: Admitting: Family Medicine

## 2024-01-21 ENCOUNTER — Other Ambulatory Visit: Payer: Self-pay

## 2024-01-21 VITALS — BP 104/72 | HR 114 | Wt 180.6 lb

## 2024-01-21 DIAGNOSIS — O0990 Supervision of high risk pregnancy, unspecified, unspecified trimester: Secondary | ICD-10-CM

## 2024-01-21 DIAGNOSIS — O0993 Supervision of high risk pregnancy, unspecified, third trimester: Secondary | ICD-10-CM

## 2024-01-21 DIAGNOSIS — Z3A36 36 weeks gestation of pregnancy: Secondary | ICD-10-CM | POA: Diagnosis present

## 2024-01-21 DIAGNOSIS — O30043 Twin pregnancy, dichorionic/diamniotic, third trimester: Secondary | ICD-10-CM

## 2024-01-21 DIAGNOSIS — R87612 Low grade squamous intraepithelial lesion on cytologic smear of cervix (LGSIL): Secondary | ICD-10-CM

## 2024-01-21 DIAGNOSIS — F4320 Adjustment disorder, unspecified: Secondary | ICD-10-CM

## 2024-01-21 DIAGNOSIS — O30042 Twin pregnancy, dichorionic/diamniotic, second trimester: Secondary | ICD-10-CM

## 2024-01-21 NOTE — Progress Notes (Signed)
   PRENATAL VISIT NOTE  Subjective:  Traci Campbell is a 28 y.o. G1P0000 at [redacted]w[redacted]d being seen today for ongoing prenatal care.  She is currently monitored for the following issues for this low-risk pregnancy and has Supervision of high risk pregnancy, antepartum; Twin pregnancy--Vanishing twin; Major depressive disorder, single episode, moderate (HCC); LGSIL on Pap smear of cervix; and Placenta succenturiate lobe affecting fetus on their problem list.  Patient reports no bleeding, no contractions, no cramping, and no leaking.  Contractions: Irritability. Vag. Bleeding: None.  Movement: Present. Denies leaking of fluid.   The following portions of the patient's history were reviewed and updated as appropriate: allergies, current medications, past family history, past medical history, past social history, past surgical history and problem list.   Objective:    Vitals:   01/21/24 1045  BP: 104/72  Pulse: (!) 114  Weight: 180 lb 9.6 oz (81.9 kg)    Fetal Status:  Fetal Heart Rate (bpm): 150   Movement: Present    General: Alert, oriented and cooperative. Patient is in no acute distress.  Skin: Skin is warm and dry. No rash noted.   Cardiovascular: Normal heart rate noted  Respiratory: Normal respiratory effort, no problems with respiration noted  Abdomen: Soft, gravid, appropriate for gestational age.  Pain/Pressure: Absent     Pelvic: Cervical exam performed in the presence of a chaperone      fingertip/thick/-3  Extremities: Normal range of motion.  Edema: Trace  Mental Status: Normal mood and affect. Normal behavior. Normal judgment and thought content.   Assessment and Plan:  Pregnancy: G1P0000 at [redacted]w[redacted]d 1. Supervision of high risk pregnancy, antepartum (Primary) FHR BP appropriate today - Culture, beta strep (group b only) - GC/Chlamydia probe amp ([place])not at [facility]  2. Placenta succenturiate lobe affecting fetus Please note at delivery  3. LGSIL on Pap smear of  cervix Will need colposcopy postpartum  4. Dichorionic diamniotic twin pregnancy in second trimester Vanishing twin pregnancy Currently singleton pregnancy  5. [redacted] weeks gestation of pregnancy - Culture, beta strep (group b only) - GC/Chlamydia probe amp ([place])not at [facility]  6. Grief reaction Patient reports she lost her brother 2 days ago and is having a tough time with it.  Would like to speak with her therapist declines any medication intervention at this time.  Referral placed - Ambulatory referral to Integrated Behavioral Health  Preterm labor symptoms and general obstetric precautions including but not limited to vaginal bleeding, contractions, leaking of fluid and fetal movement were reviewed in detail with the patient. Please refer to After Visit Summary for other counseling recommendations.   No follow-ups on file.  Future Appointments  Date Time Provider Department Center  01/23/2024  8:00 AM [facility] PROVIDER 1 [facility]  01/23/2024  8:30 AM [facility] US2 [facility]  01/23/2024 10:15 AM [facility]  01/28/2024  1:35 PM Eldonna Suzen Octave, MD [facility]  02/04/2024  8:55 AM Eldonna Suzen Octave, MD [facility]  02/11/2024  1:35 PM Eldonna Suzen Octave, MD [facility]  02/20/2024 10:35 AM Eldonna Suzen Octave, MD [facility]    Norleen LULLA Rover, MD

## 2024-01-22 LAB — GC/CHLAMYDIA PROBE AMP (~~LOC~~) NOT AT ARMC
Chlamydia: NEGATIVE
Comment: NEGATIVE
Comment: NORMAL
Neisseria Gonorrhea: NEGATIVE

## 2024-01-23 ENCOUNTER — Ambulatory Visit (HOSPITAL_COMMUNITY): Attending: Maternal & Fetal Medicine

## 2024-01-23 ENCOUNTER — Ambulatory Visit: Admitting: Clinical

## 2024-01-23 ENCOUNTER — Encounter: Payer: Self-pay | Admitting: Family Medicine

## 2024-01-23 ENCOUNTER — Ambulatory Visit: Attending: Maternal & Fetal Medicine | Admitting: Maternal & Fetal Medicine

## 2024-01-23 VITALS — BP 101/60

## 2024-01-23 DIAGNOSIS — O285 Abnormal chromosomal and genetic finding on antenatal screening of mother: Secondary | ICD-10-CM | POA: Diagnosis present

## 2024-01-23 DIAGNOSIS — O43103 Malformation of placenta, unspecified, third trimester: Secondary | ICD-10-CM

## 2024-01-23 DIAGNOSIS — O3110X0 Continuing pregnancy after spontaneous abortion of one fetus or more, unspecified trimester, not applicable or unspecified: Secondary | ICD-10-CM

## 2024-01-23 DIAGNOSIS — Z3A37 37 weeks gestation of pregnancy: Secondary | ICD-10-CM | POA: Insufficient documentation

## 2024-01-23 DIAGNOSIS — O30043 Twin pregnancy, dichorionic/diamniotic, third trimester: Secondary | ICD-10-CM

## 2024-01-23 DIAGNOSIS — O3113X0 Continuing pregnancy after spontaneous abortion of one fetus or more, third trimester, not applicable or unspecified: Secondary | ICD-10-CM | POA: Insufficient documentation

## 2024-01-23 DIAGNOSIS — F4321 Adjustment disorder with depressed mood: Secondary | ICD-10-CM

## 2024-01-23 DIAGNOSIS — O0990 Supervision of high risk pregnancy, unspecified, unspecified trimester: Secondary | ICD-10-CM

## 2024-01-23 NOTE — Patient Instructions (Signed)
 Center for [facility] [address] [place], [place] [address] [phone] (main office) [phone] [facility] office)  New Parent Support Groups www.postpartum.net www.conehealthybaby.com  [place]  (Childcare options, Early childcare development, etc.) www.[place]children.org  [place]  Child Care Facility Search Engine  https://ncchildcare.http://cook.com/     BRAINSTORMING  Develop a Plan Goals: Provide a way to start conversation about your new life with a baby Assist parents in recognizing and using resources within their reach Help pave the way before birth for an easier period of transition afterwards.  Make a list of the following information to keep in a central location: Full name of Mom and Partner: _____________________________________________ Baby's full name and Date of Birth: ___________________________________________ Home Address: ___________________________________________________________ ________________________________________________________________________ Home Phone: ____________________________________________________________ Parents' cell numbers: _____________________________________________________ ________________________________________________________________________ Name and contact info for OB: ______________________________________________ Name and contact info for Pediatrician:________________________________________ Contact info for Lactation Consultants: ________________________________________  REST and SLEEP *You each need at least 4-5 hours of uninterrupted sleep every day. Write specific names and contact information.* How are you going to rest in the postpartum period? While partner's home? When partner returns to work? When you both return to work? Where will your baby sleep? Who is available to help during the day? Evening? Night? Who could move in for a  period to help support you? What are some ideas to help you get enough sleep? __________________________________________________________________________________________________________________________________________________________________________________________________________________________________________ NUTRITIOUS FOOD AND DRINK *Plan for meals before your baby is born so you can have healthy food to eat during the immediate postpartum period.* Who will look after breakfast? Lunch? Dinner? List names and contact information. Brainstorm quick, healthy ideas for each meal. What can you do before baby is born to prepare meals for the postpartum period? How can others help you with meals? Which grocery stores provide online shopping and delivery? Which restaurants offer take-out or delivery options? ______________________________________________________________________________________________________________________________________________________________________________________________________________________________________________________________________________________________________________________________________________________________________________________________________  CARE FOR MOM *It's important that mom is cared for and pampered in the postpartum period. Remember, the most important ways new mothers need care are: sleep, nutrition, gentle exercise, and time off.* Who can come take care of mom during this period? Make a list of people with their contact information. List some activities that make you feel cared for, rested, and energized? Who can make sure you have opportunities to do these things? Does mom have a space of her very own within your home that's just for her? Make a "[facility]" where she can be comfortable, rest, and renew herself  daily. ______________________________________________________________________________________________________________________________________________________________________________________________________________________________________________________________________________________________________________________________________________________________________________________________________    CARE FOR AND FEEDING BABY *Knowledgeable and encouraging people will offer the best support with regard to feeding your baby.* Educate yourself and choose the best feeding option for your baby. Make a list of people who will guide, support, and be a resource for you as your care for and feed your baby. (Friends that have breastfed or are currently breastfeeding, lactation consultants, breastfeeding support groups, etc.) Consider a postpartum doula. (These websites can give you information: dona.org & https://shea.org/) Seek out local breastfeeding resources like the breastfeeding support group at [employer] or [employer]. ______________________________________________________________________________________________________________________________________________________________________________________________________________________________________________________________________________________________________________________________________________________________________________________________________  GAITHER AND ERRANDS Who can help with a thorough cleaning before baby is born? Make a list of people who will help with housekeeping and chores, like laundry, light cleaning, dishes, bathrooms, etc. Who can run some errands for you? What can you do to make sure you are stocked with basic supplies before baby is born? Who is going to do the  shopping? ______________________________________________________________________________________________________________________________________________________________________________________________________________________________________________________________________________________________________________________________________________________________________________________________________  Family Adjustment *Nurture yourselves.it helps parents be more loving and allows for better bonding with their child.* What sorts of things do you and partner enjoy doing together? Which activities help you to connect and strengthen your relationship? Make a list of those things. Make a list of people whom you trust to care for your baby so you can have some time together as a couple. What types of things help partner feel connected to Mom? Make a list. What needs will partner have in order to bond with baby? Other children? Who will care for them when you go into labor and while you are in the hospital? Think about what the needs of your older children might be. Who can help you meet those needs? In what ways are you helping them prepare for bringing baby home? List some specific strategies you have for family adjustment. _______________________________________________________________________________________________________________________________________________________________________________________________________________________________________________________________________________________________________________________________________________  SUPPORT *Someone who can empathize with experiences normalizes your problems and makes them more bearable.* Make a list of other friends, neighbors, and/or co-workers you know with infants (and small children, if applicable) with whom you can connect. Make a list of local or online support groups, mom groups, etc. in which you can be  involved. ______________________________________________________________________________________________________________________________________________________________________________________________________________________________________________________________________________________________________________________________________________________________________________________________________  Childcare Plans Investigate and plan for childcare if mom is returning to work. Talk about mom's concerns about her transition back to work. Talk about partner's concerns regarding this transition.  Mental Health *Your mental health is one of the highest priorities for a pregnant or postpartum mom.* 1 in 5 women experience anxiety and/or depression from the time of conception through the first year after birth. Postpartum Mood Disorders are the #1 complication of pregnancy and childbirth and the suffering experienced by these mothers is not necessary! These illnesses are temporary and respond well to treatment, which often includes self-care, social support, talk therapy, and medication when needed. Women experiencing anxiety and depression often say things like: "I'm supposed to be happy.why do I feel so sad?", "Why can't I snap out of it?", "I'm having thoughts that scare me." There is no need to be embarrassed if you are feeling these symptoms: Overwhelmed, anxious, angry, sad, guilty, irritable, hopeless, exhausted but can't sleep You are NOT alone. You are NOT to blame. With help, you WILL be well. Where can I find help? Medical professionals such as your OB, midwife, gynecologist, family practitioner, primary care provider, pediatrician, or mental health providers; [facility] support groups: Feelings After Birth, Breastfeeding Support Group, Baby and Me Group, and Fit 4 Two exercise classes. You have permission to ask for help. It will confirm your feelings, validate your experiences,  share/learn coping strategies, and gain support and encouragement as you heal. You are important! BRAINSTORM Make a list of local resources, including resources for mom and for partner. Identify support groups. Identify people to call late at night - include names and contact info. Talk with partner about perinatal mood and anxiety disorders. Talk with your OB, midwife, and doula about baby blues and about perinatal mood and anxiety disorders. Talk with your pediatrician about perinatal mood and anxiety disorders.   Support & Sanity Savers   What do you really need?  Basics In preparing for a new baby, many expectant parents spend hours shopping for baby clothes, decorating the nursery, and deciding which car seat to buy. Yet most don't think much about what the reality of parenting a newborn will be like, and what they need to make it through that. So, here is the advice of  experienced parents. We know you'll read this, and think "they're exaggerating, I don't really need that." Just trust us  on these, OK? Plan for all of this, and if it turns out you don't need it, come back and teach us  how you did it!  Must-Haves (Once baby's survival needs are met, make sure you attend to your own survival needs!) Sleep An average newborn sleeps 16-18 hours per day, over 6-7 sleep periods, rarely more than three hours at a time. It is normal and healthy for a newborn to wake throughout the night... but really hard on parents!! Naps. Prioritize sleep above any responsibilities like: cleaning house, visiting friends, running errands, etc.  Sleep whenever baby sleeps. If you can't nap, at least have restful times when baby eats. The more rest you get, the more patient you will be, the more emotionally stable, and better at solving problems.  Food You may not have realized it would be difficult to eat when you have a newborn. Yet, when we talk to countless new parents, they say things like "it may be 2:00 pm  when I realize I haven't had breakfast yet." Or "every time we sit down to dinner, baby needs to eat, and my food gets cold, so I don't bother to eat it." Finger food. Before your baby is born, stock up with one months' worth of food that: 1) you can eat with one hand while holding a baby, 2) doesn't need to be prepped, 3) is good hot or cold, 4) doesn't spoil when left out for a few hours, and 5) you like to eat. Think about: nuts, dried fruit, Clif bars, pretzels, jerky, gogurt, baby carrots, apples, bananas, crackers, cheez-n-crackers, string cheese, hot pockets or frozen burritos to microwave, garden burgers and breakfast pastries to put in the toaster, yogurt drinks, etc. Restaurant Menus. Make lists of your favorite restaurants & menu items. When family/friends want to help, you can give specific information without much thought. They can either bring you the food or send gift cards for just the right meals. Freezer Meals.  Take some time to make a few meals to put in the freezer ahead of time.  Easy to freeze meals can be anything such as soup, lasagna, chicken pie, or spaghetti sauce. Set up a Meal Schedule.  Ask friends and family to sign up to bring you meals during the first few weeks of being home. (It can be passed around at baby showers!) You have no idea how helpful this will be until you are in the throes of parenting.  MachineLive.it is a great website to check out. Emotional Support Know who to call when you're stressed out. Parenting a newborn is very challenging work. There are times when it totally overwhelms your normal coping abilities. EVERY NEW PARENT NEEDS TO HAVE A PLAN FOR WHO TO CALL WHEN THEY JUST CAN'T COPE ANY MORE. (And it has to be someone other than the baby's other parent!) Before your baby is born, come up with at least one person you can call for support - write their phone number down and post it on the refrigerator. Anxiety & Sadness. Baby blues are normal after  pregnancy; however, there are more severe types of anxiety & sadness which can occur and should not be ignored.  They are always treatable, but you have to take the first step by reaching out for help. [facility] offers a "Mom Talk" group which meets every Tuesday from 10 am - 11 am.  This group is for new moms who need support and connection after their babies are born.  Call [phone].  Really, Really Helpful (Plan for them! Make sure these happen often!!) Physical Support with Taking Care of Yourselves Asking friends and family. Before your baby is born, set up a schedule of people who can come and visit and help out (or ask a friend to schedule for you). Any time someone says "let me know what I can do to help," sign them up for a day. When they get there, their job is not to take care of the baby (that's your job and your joy). Their job is to take care of you!  Postpartum doulas. If you don't have anyone you can call on for support, look into postpartum doulas:  professionals at helping parents with caring for baby, caring for themselves, getting breastfeeding started, and helping with household tasks. www.padanc.org is a helpful website for learning about doulas in our area. Peer Support / Parent Groups Why: One of the greatest ideas for new parents is to be around other new parents. Parent groups give you a chance to share and listen to others who are going through the same season of life, get a sense of what is normal infant development by watching several babies learn and grow, share your stories of triumph and struggles with empathetic ears, and forgive your own mistakes when you realize all parents are learning by trial and error. Where to find: There are many places you can meet other new parents throughout our community.  [facility] offers the following classes for new moms and their little ones:  Baby and Me (Birth to Crawling) and Breastfeeding Support Group. Go to  www.conehealthybaby.com or call [phone] for more information. Time for your Relationship It's easy to get so caught up in meeting baby's immediate needs that it's hard to find time to connect with your partner, and meet the needs of your relationship. It's also easy to forget what "quality time with your partner" actually looks like. If you take your baby on a date, you'd be amazed how much of your couple time is spent feeding the baby, diapering the baby, admiring the baby, and talking about the baby. Dating: Try to take time for just the two of you. Babysitter tip: Sometimes when moms are breastfeeding a newborn, they find it hard to figure out how to schedule outings around baby's unpredictable feeding schedules. Have the babysitter come for a three hour period. When she comes over, if baby has just eaten, you can leave right away, and come back in two hours. If baby hasn't fed recently, you start the date at home. Once baby gets hungry and gets a good feeding in, you can head out for the rest of your date time. Date Nights at Home: If you can't get out, at least set aside one evening a week to prioritize your relationship: whenever baby dozes off or doesn't have any immediate needs, spend a little time focusing on each other. Potential conflicts: The main relationship conflicts that come up for new parents are: issues related to sexuality, financial stresses, a feeling of an unfair division of household tasks, and conflicts in parenting styles. The more you can work on these issues before baby arrives, the better!  Fun and Frills (Don't forget these. and don't feel guilty for indulging in them!) Everyone has something in life that is a fun little treat that they do just for themselves. It may be: reading  the morning paper, or going for a daily jog, or having coffee with a friend once a week, or going to a movie on Friday nights, or fine chocolates, or bubble baths, or curling up with a good  book. Unless you do fun things for yourself every now and then, it's hard to have the energy for fun with your baby. Whatever your "special" treats are, make sure you find a way to continue to indulge in them after your baby is born. These special moments can recharge you, and allow you to return to baby with a new joy   PERINATAL MOOD DISORDERS: MATERNAL MENTAL HEALTH FROM CONCEPTION THROUGH THE POSTPARTUM PERIOD   _________________________________________Emergency and Crisis Resources If you are an imminent risk to self or others, are experiencing intense personal distress, and/or have noticed significant changes in activities of daily living, call:  911 [facility]: [phone]  [address], [place], [place], [address] Mobile Crisis: [phone] National Suicide Hotline: 9 Or visit the following crisis centers: Local Emergency Departments [facility]:  [address], [place]  Mon-Friday 8am-3pm, [phone]                                                                                  ___________ Non-Crisis Resources To identify specific providers that are covered by your insurance, contact your insurance company or local agencies:   Postpartum Support International- Warm-line: (302)311-3780                                                      __Outpatient Therapy and Medication Management   Providers:  [facility]: [phone] Hours: 9AM-5PM  Insurance Accepted: BRYAN [place], BCBS, Sherleen Hough, [place], Medicare  [facility] of Care): [phone] Hours: 8AM-5:30PM  nsurance Accepted: All insurances EXCEPT AARP, [place], [place], and [employer] of the [place]: [phone] Hours: 8AM-8PM Insurance Accepted: [place] OLIPHANT, Sherleen Hough, [place], Medicare, Delano Gasper [phone] [facility]: [phone] Hours: 8:30AM-7PM Insurance Accepted: [place] OLIPHANT, Medicaid, Medicare, Tricare, [employer] [facility]:  [phone]   Hours:9AM-5PM Insurance Accepted:  [place], OLIPHANT, [employer], [place], [employer] Care  [facility]: [phone] Hours: 9AM-5:30PM Insurance Accepted: BRYAN Leyden, OLIPHANT Sherleen, and Medicaid, Medicare, [facility]:  [phone] Hours: 9am-5pm Insurance Accepted: BCBS; they do not accept Medicaid/Medicare [facility]: [phone] Hours: 9am-9pm Insurance Accepted: All major insurance including Medicaid and Medicare [facility]: [phone] Hours: 9AM- 5PM Insurance Accepted: All insurances EXCEPT Medicaid and Medicare. [facility]: [phone]   ____________                                                                     Parenting  Support Groups [facility] :  [address], [place], [place], [address] [phone] [facility]:  [phone] Family Support Network: (support for children in the NICU and/or with special needs), [phone]     _____________                                                                                  Online Resources Postpartum Support International: SeekAlumni.co.za  800-944-4PPD Supporting Moms:  www.momssupportingmoms.net

## 2024-01-23 NOTE — BH Specialist Note (Signed)
 Integrated Behavioral Health via Telemedicine Visit  01/23/2024 Traci Campbell [id number]  Number of Integrated Behavioral Health Clinician visits: 1- Initial Visit  Session Start time: 1016   Session End time: 1109  Total time in minutes: 53    Referring Provider: Norleen Rover, MD Patient/Family location: Home [facility] Provider location: Center for [facility] Healthcare at [facility]  All persons participating in visit: Patient Traci Campbell and [facility] Mega Kinkade   Types of Service: Individual psychotherapy and Video visit  I connected with Traci Campbell and/or Traci Campbell's n/a via  Telephone or Video Enabled Telemedicine Application  (Video is Caregility application) and verified that I am speaking with the correct person using two identifiers. Discussed confidentiality: Yes   I discussed the limitations of telemedicine and the availability of in person appointments.  Discussed there is a possibility of technology failure and discussed alternative modes of communication if that failure occurs.  I discussed that engaging in this telemedicine visit, they consent to the provision of behavioral healthcare and the services will be billed under their insurance.  Patient and/or legal guardian expressed understanding and consented to Telemedicine visit: Yes   Presenting Concerns: Patient and/or family reports the following symptoms/concerns: Grieving the unexpected loss of her 33yo brother, and unable to attend service in [place] due to [redacted]wks pregnant; grieving loss of one twin in early pregnancy. Pt has experienced depression in the past and worries about coping postpartum; open to implementing self-coping strategy to help manage current emotions.  Duration of problem: Four days since loss  Patient and/or Family's Strengths/Protective Factors: Social connections, Concrete supports in place (healthy food, safe environments, etc.), and Sense of purpose  Goals Addressed: Patient  will:  Reduce symptoms of: anxiety and depression   Increase knowledge and/or ability of: healthy habits and self-management skills   Demonstrate ability to: Increase healthy adjustment to current life circumstances and Begin healthy grieving over loss  Progress towards Goals: Ongoing    Interventions: Interventions utilized:  Mindfulness or Management consultant, Supportive Counseling, Psychoeducation and/or Health Education, and Link to [employer] Standardized Assessments completed: Not Needed    Patient and/or Family Response: Patient agrees with treatment plan.   Clinical Assessment/Diagnosis  Grief    Patient may benefit from psychoeducation and brief therapeutic interventions regarding coping with symptoms of anxiety and depression, as it relates to current grief .  Plan: Follow up with behavioral health clinician on : One weeks Behavioral recommendations:  -Continue taking prenatal vitamin and iron pill, as prescribed -Begin prioritizing healthy self-care (regular meals, adequate rest; allowing practical help from supportive friends and family)  CALM relaxation breathing exercise twice daily (morning; at bedtime with sleep sounds); as needed throughout the day. -Read through information on After Visit Summary; use as needed and discussed -Consider having family face time at brother's service, to be able to attend virtually -Celebrate upcoming 28th birthday in your own way, even in the midst of grief  Referral(s): Integrated Art gallery manager (In Clinic) and [employer]:  New parent support groups  I discussed the assessment and treatment plan with the patient and/or parent/guardian. They were provided an opportunity to ask questions and all were answered. They agreed with the plan and demonstrated an understanding of the instructions.   They were advised to call back or seek an in-person evaluation if the symptoms worsen or if the condition fails  to improve as anticipated.  Traci Shands C Icker Swigert, LCSW     01/21/2024  1:19 PM 08/10/2023   12:12 PM 11/22/2020    2:13 PM 09/06/2020    5:50 PM 06/08/2020    3:47 PM  Depression screen PHQ 2/9  Decreased Interest 0 0 0 0 0  Down, Depressed, Hopeless 1 0 0 0 0  PHQ - 2 Score 1 0 0 0 0  Altered sleeping 2 2 0 1 1  Tired, decreased energy 0 2 0 1 1  Change in appetite 0 0 0 1 0  Feeling bad or failure about yourself  0 0 0 0 0  Trouble concentrating 0 0 0 0 0  Moving slowly or fidgety/restless 0 0 0 0 0  Suicidal thoughts 0 0 0 0 0  PHQ-9 Score 3 4 0 3 2  Difficult doing work/chores   Not difficult at all        01/21/2024    1:19 PM 08/10/2023   12:12 PM 11/22/2020    2:13 PM 09/06/2020    5:50 PM  GAD 7 : Generalized Anxiety Score  Nervous, Anxious, on Edge 2 1 0 0  Control/stop worrying 1 0 0 0  Worry too much - different things 1 0 0 0  Trouble relaxing 0 0 0 0  Restless 0 0 0 0  Easily annoyed or irritable 1 1 0 0  Afraid - awful might happen 0 0 0 0  Total GAD 7 Score 5 2 0 0  Anxiety Difficulty   Not difficult at all

## 2024-01-23 NOTE — Progress Notes (Signed)
 After review, MFM consult with provider is not indicated for today  William Glenn, DO 01/23/2024 9:05 AM  [facility]

## 2024-01-25 LAB — CULTURE, BETA STREP (GROUP B ONLY): Strep Gp B Culture: NEGATIVE

## 2024-01-28 ENCOUNTER — Other Ambulatory Visit (HOSPITAL_COMMUNITY)
Admission: RE | Admit: 2024-01-28 | Discharge: 2024-01-28 | Disposition: A | Discharge status: Home / Self Care | Source: Ambulatory Visit | Attending: Family Medicine | Admitting: Family Medicine

## 2024-01-28 ENCOUNTER — Ambulatory Visit: Admitting: Family Medicine

## 2024-01-28 ENCOUNTER — Other Ambulatory Visit: Payer: Self-pay

## 2024-01-28 DIAGNOSIS — O0990 Supervision of high risk pregnancy, unspecified, unspecified trimester: Secondary | ICD-10-CM

## 2024-01-28 DIAGNOSIS — O0993 Supervision of high risk pregnancy, unspecified, third trimester: Secondary | ICD-10-CM

## 2024-01-28 DIAGNOSIS — O26893 Other specified pregnancy related conditions, third trimester: Secondary | ICD-10-CM | POA: Insufficient documentation

## 2024-01-28 DIAGNOSIS — O30043 Twin pregnancy, dichorionic/diamniotic, third trimester: Secondary | ICD-10-CM

## 2024-01-28 DIAGNOSIS — N898 Other specified noninflammatory disorders of vagina: Secondary | ICD-10-CM | POA: Diagnosis present

## 2024-01-28 DIAGNOSIS — R87612 Low grade squamous intraepithelial lesion on cytologic smear of cervix (LGSIL): Secondary | ICD-10-CM

## 2024-01-28 DIAGNOSIS — Z3A37 37 weeks gestation of pregnancy: Secondary | ICD-10-CM

## 2024-01-28 NOTE — Progress Notes (Signed)
   PRENATAL VISIT NOTE  Subjective:  Stephen N Allbright is a 28 y.o. G1P0000 at [redacted]w[redacted]d being seen today for ongoing prenatal care.  She is currently monitored for the following issues for this low-risk pregnancy and has Supervision of high risk pregnancy, antepartum; Twin pregnancy--Vanishing twin; Major depressive disorder, single episode, moderate (HCC); LGSIL on Pap smear of cervix; and Placenta succenturiate lobe affecting fetus on their problem list.  Patient reports no complaints.  Contractions: Not present. Vag. Bleeding: None.  Movement: Present. Denies leaking of fluid.   The following portions of the patient's history were reviewed and updated as appropriate: allergies, current medications, past family history, past medical history, past social history, past surgical history and problem list.   Objective:    Vitals:   01/28/24 1334  BP: 103/73  Pulse: (!) 118  Weight: 183 lb 14.4 oz (83.4 kg)    Fetal Status:  Fetal Heart Rate (bpm): 149 Fundal Height: 38 cm Movement: Present Presentation: Vertex  General: Alert, oriented and cooperative. Patient is in no acute distress.  Skin: Skin is warm and dry. No rash noted.   Cardiovascular: Normal heart rate noted  Respiratory: Normal respiratory effort, no problems with respiration noted  Abdomen: Soft, gravid, appropriate for gestational age.  Pain/Pressure: Absent     Pelvic: Cervical exam deferred Dilation: Closed Effacement (%): 50 Station: -3  Extremities: Normal range of motion.  Edema: Trace  Mental Status: Normal mood and affect. Normal behavior. Normal judgment and thought content.   Assessment and Plan:  Pregnancy: G1P0000 at [redacted]w[redacted]d 1. Placenta succenturiate lobe affecting fetus (Primary) Note at delivery  2. Supervision of high risk pregnancy, antepartum Up to date FH appropriate Vigorous movement GBS neg Requested cervical exam check today Noted vaginal itching and thin white discharge and desires swab today-  collected Reviewed labor precautions  3. Dichorionic diamniotic twin pregnancy in third trimester Managing like singleton pregnancy at this point.   Term labor symptoms and general obstetric precautions including but not limited to vaginal bleeding, contractions, leaking of fluid and fetal movement were reviewed in detail with the patient. Please refer to After Visit Summary for other counseling recommendations.   Return in about 1 week (around 02/04/2024) for Routine prenatal care.  Future Appointments  Date Time Provider Department Center  02/04/2024  8:55 AM Eldonna Suzen Octave, MD [facility]  02/04/2024  1:15 PM [facility] HEALTH CLINICIAN [facility]  02/11/2024  1:35 PM Eldonna Suzen Octave, MD [facility]  02/20/2024 10:35 AM Eldonna Suzen Octave, MD [facility]    Suzen Octave Eldonna, MD

## 2024-01-30 LAB — CERVICOVAGINAL ANCILLARY ONLY
Bacterial Vaginitis (gardnerella): POSITIVE — AB
Candida Glabrata: NEGATIVE
Candida Vaginitis: NEGATIVE
Comment: NEGATIVE
Comment: NEGATIVE
Comment: NEGATIVE
Comment: NEGATIVE
Trichomonas: NEGATIVE

## 2024-01-31 ENCOUNTER — Encounter: Payer: Self-pay | Admitting: Family Medicine

## 2024-01-31 ENCOUNTER — Other Ambulatory Visit: Payer: Self-pay

## 2024-01-31 DIAGNOSIS — B9689 Other specified bacterial agents as the cause of diseases classified elsewhere: Secondary | ICD-10-CM

## 2024-01-31 MED ORDER — METRONIDAZOLE 500 MG PO TABS: 500.0000 mg | ORAL_TABLET | Freq: Two times a day (BID) | ORAL | 0 refills | Status: DC

## 2024-02-01 NOTE — BH Specialist Note (Unsigned)
 Integrated Behavioral Health via Telemedicine Visit  02/04/2024 Phyillis N Heady [id number]  Number of Integrated Behavioral Health Clinician visits: 2- Second Visit  Session Start time: 1316   Session End time: 1334  Total time in minutes: 18   Referring Provider: Norleen Rover, MD Patient/Family location: Home [facility] Provider location: Center for [facility] at [facility] for [facility]  All persons participating in visit: Patient Traci Campbell and [facility] Brihana Quickel   Types of Service: Individual psychotherapy and Video visit  I connected with Santiago N Gallentine and/or Truly N Prajapati's n/a via  Telephone or Video Enabled Telemedicine Application  (Video is Caregility application) and verified that I am speaking with the correct person using two identifiers. Discussed confidentiality: Yes   I discussed the limitations of telemedicine and the availability of in person appointments.  Discussed there is a possibility of technology failure and discussed alternative modes of communication if that failure occurs.  I discussed that engaging in this telemedicine visit, they consent to the provision of behavioral healthcare and the services will be billed under their insurance.  Patient and/or legal guardian expressed understanding and consented to Telemedicine visit: Yes   Presenting Concerns: Patient and/or family reports the following symptoms/concerns: Increasing anxiety regarding upcoming birth and worry about losing other loved ones in the future; less panic the past two weeks; was able to face time family to attend brother's memorial service virtually.  Duration of problem: Over two weeks; Severity of problem: moderate  Patient and/or Family's Strengths/Protective Factors: Social connections, Concrete supports in place (healthy food, safe environments, etc.), Sense of purpose, and Physical Health (exercise, healthy diet, medication compliance, etc.)  Goals Addressed: Patient  will:  Maintain reduced symptoms of: anxiety and depression    Demonstrate ability to: Increase healthy adjustment to current life circumstances and Increase adequate support systems for patient/family  Progress towards Goals: Ongoing    Interventions: Interventions utilized:  Supportive Reflection Standardized Assessments completed: Not Needed  Patient and/or Family Response: Patient agrees with treatment plan.   Clinical Assessment/Diagnosis  Adjustment disorder with anxious mood  Grief .   Patient may benefit from continued therapeutic intervention   Plan: Follow up with behavioral health clinician on : Two weeks postpartum Behavioral recommendations:  -Continue prenatal vitamin and iron pill, as prescribed -Continue prioritizing healthy self-care (regular meals, adequate rest; allowing practical help from supportive friends and family) ; breathing exercises as needed -Continue to allow feelings of grief to come and go  Referral(s): Integrated [employer] (In Clinic)  I discussed the assessment and treatment plan with the patient and/or parent/guardian. They were provided an opportunity to ask questions and all were answered. They agreed with the plan and demonstrated an understanding of the instructions.   They were advised to call back or seek an in-person evaluation if the symptoms worsen or if the condition fails to improve as anticipated.  Warren BROCKS Masha Orbach, LCSW     01/21/2024    1:19 PM 08/10/2023   12:12 PM 11/22/2020    2:13 PM 09/06/2020    5:50 PM 06/08/2020    3:47 PM  Depression screen PHQ 2/9  Decreased Interest 0 0 0 0 0  Down, Depressed, Hopeless 1 0 0 0 0  PHQ - 2 Score 1 0 0 0 0  Altered sleeping 2 2 0 1 1  Tired, decreased energy 0 2 0 1 1  Change in appetite 0 0 0 1 0  Feeling bad or failure about yourself  0 0 0 0 0  Trouble concentrating 0 0 0 0 0  Moving slowly or fidgety/restless 0 0 0 0 0  Suicidal thoughts 0 0 0 0 0  PHQ-9  Score 3 4 0 3 2  Difficult doing work/chores   Not difficult at all        01/21/2024    1:19 PM 08/10/2023   12:12 PM 11/22/2020    2:13 PM 09/06/2020    5:50 PM  GAD 7 : Generalized Anxiety Score  Nervous, Anxious, on Edge 2 1 0 0  Control/stop worrying 1 0 0 0  Worry too much - different things 1 0 0 0  Trouble relaxing 0 0 0 0  Restless 0 0 0 0  Easily annoyed or irritable 1 1 0 0  Afraid - awful might happen 0 0 0 0  Total GAD 7 Score 5 2 0 0  Anxiety Difficulty   Not difficult at all

## 2024-02-04 ENCOUNTER — Other Ambulatory Visit: Payer: Self-pay

## 2024-02-04 ENCOUNTER — Ambulatory Visit: Admitting: Family Medicine

## 2024-02-04 ENCOUNTER — Ambulatory Visit

## 2024-02-04 VITALS — BP 116/72 | HR 114 | Wt 188.0 lb

## 2024-02-04 DIAGNOSIS — F4322 Adjustment disorder with anxiety: Secondary | ICD-10-CM | POA: Diagnosis not present

## 2024-02-04 DIAGNOSIS — F321 Major depressive disorder, single episode, moderate: Secondary | ICD-10-CM

## 2024-02-04 DIAGNOSIS — O0990 Supervision of high risk pregnancy, unspecified, unspecified trimester: Secondary | ICD-10-CM

## 2024-02-04 DIAGNOSIS — R87612 Low grade squamous intraepithelial lesion on cytologic smear of cervix (LGSIL): Secondary | ICD-10-CM

## 2024-02-04 DIAGNOSIS — F4321 Adjustment disorder with depressed mood: Secondary | ICD-10-CM

## 2024-02-04 DIAGNOSIS — O30043 Twin pregnancy, dichorionic/diamniotic, third trimester: Secondary | ICD-10-CM

## 2024-02-04 DIAGNOSIS — Z3A38 38 weeks gestation of pregnancy: Secondary | ICD-10-CM

## 2024-02-04 DIAGNOSIS — O0993 Supervision of high risk pregnancy, unspecified, third trimester: Secondary | ICD-10-CM

## 2024-02-04 NOTE — Progress Notes (Signed)
   PRENATAL VISIT NOTE  Subjective:  Traci Campbell is a 28 y.o. G1P0000 at [redacted]w[redacted]d being seen today for ongoing prenatal care.  She is currently monitored for the following issues for this low-risk pregnancy and has Supervision of high risk pregnancy, antepartum; Twin pregnancy--Vanishing twin; Major depressive disorder, single episode, moderate (HCC); LGSIL on Pap smear of cervix; and Placenta succenturiate lobe affecting fetus on their problem list.  Patient reports no complaints.  Contractions: Not present. Vag. Bleeding: None.  Movement: Present. Denies leaking of fluid.   The following portions of the patient's history were reviewed and updated as appropriate: allergies, current medications, past family history, past medical history, past social history, past surgical history and problem list.   Objective:    Vitals:   02/04/24 0902  BP: 116/72  Pulse: (!) 114  Weight: 188 lb (85.3 kg)    Fetal Status:  Fetal Heart Rate (bpm): 150 Fundal Height: 38 cm Movement: Present Presentation: Vertex  General: Alert, oriented and cooperative. Patient is in no acute distress.  Skin: Skin is warm and dry. No rash noted.   Cardiovascular: Normal heart rate noted  Respiratory: Normal respiratory effort, no problems with respiration noted  Abdomen: Soft, gravid, appropriate for gestational age.  Pain/Pressure: Absent     Pelvic: Cervical exam performed in the presence of a chaperone Dilation: 1 Effacement (%): Thick Station: -3  Extremities: Normal range of motion.  Edema: None  Mental Status: Normal mood and affect. Normal behavior. Normal judgment and thought content.   Assessment and Plan:  Pregnancy: G1P0000 at [redacted]w[redacted]d 1. Supervision of high risk pregnancy, antepartum (Primary) UTD Vigorous movement No concern today Desired her cervix checked. Reviewed option to sweep membranes at next visit Reviewed IOL at 41+ week, agrees to scheduling IOL 9/3- prefers midnight  2. Major depressive  disorder, single episode, moderate (HCC) Stable.   3. Placenta succenturiate lobe affecting fetus  4. Dichorionic diamniotic twin pregnancy in third trimester Vanishing twin, treat like singleton.   5. LGSIL on Pap smear of cervix Postpartum colpo  Preterm labor symptoms and general obstetric precautions including but not limited to vaginal bleeding, contractions, leaking of fluid and fetal movement were reviewed in detail with the patient. Please refer to After Visit Summary for other counseling recommendations.   Return in about 1 week (around 02/11/2024) for Mom+Baby Combined Care.  Future Appointments  Date Time Provider Department Center  02/04/2024  1:15 PM McMannes, Jamie C, LCSW [facility]  02/11/2024  1:35 PM Eldonna Suzen Octave, MD [facility]  02/20/2024 10:35 AM Eldonna Suzen Octave, MD [facility]    Suzen Octave Eldonna, MD

## 2024-02-11 ENCOUNTER — Ambulatory Visit: Admitting: Family Medicine

## 2024-02-11 ENCOUNTER — Other Ambulatory Visit: Payer: Self-pay

## 2024-02-11 VITALS — BP 113/74 | HR 92 | Wt 188.0 lb

## 2024-02-11 DIAGNOSIS — F321 Major depressive disorder, single episode, moderate: Secondary | ICD-10-CM

## 2024-02-11 DIAGNOSIS — Z3A39 39 weeks gestation of pregnancy: Secondary | ICD-10-CM

## 2024-02-11 DIAGNOSIS — O0993 Supervision of high risk pregnancy, unspecified, third trimester: Secondary | ICD-10-CM

## 2024-02-11 DIAGNOSIS — O30043 Twin pregnancy, dichorionic/diamniotic, third trimester: Secondary | ICD-10-CM

## 2024-02-11 DIAGNOSIS — O0990 Supervision of high risk pregnancy, unspecified, unspecified trimester: Secondary | ICD-10-CM

## 2024-02-11 NOTE — Progress Notes (Signed)
   PRENATAL VISIT NOTE  Subjective:  Traci Campbell is a 28 y.o. G1P0000 at [redacted]w[redacted]d being seen today for ongoing prenatal care.  She is currently monitored for the following issues for this low-risk pregnancy and has Supervision of high risk pregnancy, antepartum; Twin pregnancy--Vanishing twin; Major depressive disorder, single episode, moderate (HCC); LGSIL on Pap smear of cervix; and Placenta succenturiate lobe affecting fetus on their problem list.  Patient reports no complaints.  Contractions: Irritability. Vag. Bleeding: None.  Movement: Present. Denies leaking of fluid.   The following portions of the patient's history were reviewed and updated as appropriate: allergies, current medications, past family history, past medical history, past social history, past surgical history and problem list.   Objective:    Vitals:   02/11/24 1338  BP: 113/74  Pulse: 92  Weight: 188 lb (85.3 kg)    Fetal Status:  Fetal Heart Rate (bpm): 140 Fundal Height: 39 cm Movement: Present Presentation: Vertex  General: Alert, oriented and cooperative. Patient is in no acute distress.  Skin: Skin is warm and dry. No rash noted.   Cardiovascular: Normal heart rate noted  Respiratory: Normal respiratory effort, no problems with respiration noted  Abdomen: Soft, gravid, appropriate for gestational age.  Pain/Pressure: Absent     Pelvic: Cervical exam performed in the presence of a chaperone Dilation: 1 Effacement (%): Thick Station: -3  Extremities: Normal range of motion.  Edema: None  Mental Status: Normal mood and affect. Normal behavior. Normal judgment and thought content.   Assessment and Plan:  Pregnancy: G1P0000 at [redacted]w[redacted]d 1. Supervision of high risk pregnancy, antepartum (Primary) Up to date FH appropriate Vigorous movement Concerns: None. Reviewed IOL on 9/3   2. Dichorionic diamniotic twin pregnancy in third trimester Vanishing twin, functional singleton  3. Placenta succenturiate lobe  affecting fetus Be mindful during 3rd stage  4. Major depressive disorder, single episode, moderate (HCC) Stable  Preterm labor symptoms and general obstetric precautions including but not limited to vaginal bleeding, contractions, leaking of fluid and fetal movement were reviewed in detail with the patient. Please refer to After Visit Summary for other counseling recommendations.   Return Contractions, bleeding, loss of fluid, for to MAU.  Future Appointments  Date Time Provider Department Center  02/20/2024 12:00 AM [facility] SCHED ROOM [facility] None  02/20/2024 10:35 AM Eldonna Suzen Octave, MD [facility]  03/06/2024 10:45 AM [facility] HEALTH CLINICIAN [facility]    Suzen Octave Eldonna, MD

## 2024-02-13 ENCOUNTER — Inpatient Hospital Stay (HOSPITAL_COMMUNITY): Admission: RE | Admit: 2024-02-13 | Source: Ambulatory Visit

## 2024-02-15 ENCOUNTER — Telehealth (HOSPITAL_COMMUNITY): Payer: Self-pay | Admitting: *Deleted

## 2024-02-15 ENCOUNTER — Encounter (HOSPITAL_COMMUNITY): Payer: Self-pay

## 2024-02-15 NOTE — Telephone Encounter (Signed)
 Preadmission screen

## 2024-02-20 ENCOUNTER — Inpatient Hospital Stay (HOSPITAL_COMMUNITY): Admitting: Anesthesiology

## 2024-02-20 ENCOUNTER — Encounter (HOSPITAL_COMMUNITY): Payer: Self-pay | Admitting: Obstetrics and Gynecology

## 2024-02-20 ENCOUNTER — Encounter: Admitting: Family Medicine

## 2024-02-20 ENCOUNTER — Inpatient Hospital Stay (HOSPITAL_COMMUNITY)

## 2024-02-20 ENCOUNTER — Other Ambulatory Visit: Payer: Self-pay

## 2024-02-20 ENCOUNTER — Inpatient Hospital Stay (HOSPITAL_COMMUNITY)
Admission: RE | Admit: 2024-02-20 | Discharge: 2024-02-22 | DRG: 807 | Disposition: A | Discharge status: Home / Self Care | Attending: Obstetrics and Gynecology | Admitting: Obstetrics and Gynecology

## 2024-02-20 DIAGNOSIS — Z3A41 41 weeks gestation of pregnancy: Secondary | ICD-10-CM

## 2024-02-20 DIAGNOSIS — Z7982 Long term (current) use of aspirin: Secondary | ICD-10-CM | POA: Diagnosis not present

## 2024-02-20 DIAGNOSIS — O3120X0 Continuing pregnancy after intrauterine death of one fetus or more, unspecified trimester, not applicable or unspecified: Secondary | ICD-10-CM | POA: Diagnosis not present

## 2024-02-20 DIAGNOSIS — O43193 Other malformation of placenta, third trimester: Secondary | ICD-10-CM | POA: Diagnosis present

## 2024-02-20 DIAGNOSIS — Z23 Encounter for immunization: Secondary | ICD-10-CM

## 2024-02-20 DIAGNOSIS — O480 Post-term pregnancy: Secondary | ICD-10-CM | POA: Diagnosis present

## 2024-02-20 DIAGNOSIS — Z8249 Family history of ischemic heart disease and other diseases of the circulatory system: Secondary | ICD-10-CM

## 2024-02-20 DIAGNOSIS — R87612 Low grade squamous intraepithelial lesion on cytologic smear of cervix (LGSIL): Secondary | ICD-10-CM | POA: Diagnosis present

## 2024-02-20 LAB — CBC
HCT: 29.1 % — ABNORMAL LOW (ref 36.0–46.0)
Hemoglobin: 9.1 g/dL — ABNORMAL LOW (ref 12.0–15.0)
MCH: 26.1 pg (ref 26.0–34.0)
MCHC: 31.3 g/dL (ref 30.0–36.0)
MCV: 83.6 fL (ref 80.0–100.0)
Platelets: 309 K/uL (ref 150–400)
RBC: 3.48 MIL/uL — ABNORMAL LOW (ref 3.87–5.11)
RDW: 16 % — ABNORMAL HIGH (ref 11.5–15.5)
WBC: 11.3 K/uL — ABNORMAL HIGH (ref 4.0–10.5)
nRBC: 0 % (ref 0.0–0.2)

## 2024-02-20 LAB — TYPE AND SCREEN
ABO/RH(D): A POS
Antibody Screen: NEGATIVE

## 2024-02-20 LAB — RPR: RPR Ser Ql: NONREACTIVE

## 2024-02-20 MED ORDER — DIPHENHYDRAMINE HCL 50 MG/ML IJ SOLN: 12.5000 mg | INTRAMUSCULAR | Status: DC | PRN

## 2024-02-20 MED ORDER — OXYCODONE-ACETAMINOPHEN 5-325 MG PO TABS: 1.0000 | ORAL_TABLET | ORAL | Status: DC | PRN

## 2024-02-20 MED ORDER — SENNOSIDES-DOCUSATE SODIUM 8.6-50 MG PO TABS
2.0000 | ORAL_TABLET | ORAL | Status: DC
  Administered 2024-02-21 – 2024-02-22 (×2): 2 via ORAL
  Filled 2024-02-20 (×2): qty 2

## 2024-02-20 MED ORDER — LACTATED RINGERS IV SOLN
500.0000 mL | INTRAVENOUS | Status: DC | PRN
  Administered 2024-02-20: 500 mL via INTRAVENOUS

## 2024-02-20 MED ORDER — OXYCODONE-ACETAMINOPHEN 5-325 MG PO TABS: 2.0000 | ORAL_TABLET | ORAL | Status: DC | PRN

## 2024-02-20 MED ORDER — PHENYLEPHRINE 80 MCG/ML (10ML) SYRINGE FOR IV PUSH (FOR BLOOD PRESSURE SUPPORT)
80.0000 ug | PREFILLED_SYRINGE | INTRAVENOUS | Status: DC | PRN
  Filled 2024-02-20: qty 10

## 2024-02-20 MED ORDER — FENTANYL-BUPIVACAINE-NACL 0.5-0.125-0.9 MG/250ML-% EP SOLN
12.0000 mL/h | EPIDURAL | Status: DC | PRN
  Administered 2024-02-20: 12 mL/h via EPIDURAL
  Filled 2024-02-20: qty 250

## 2024-02-20 MED ORDER — ONDANSETRON HCL 4 MG/2ML IJ SOLN: 4.0000 mg | INTRAMUSCULAR | Status: DC | PRN

## 2024-02-20 MED ORDER — LIDOCAINE HCL (PF) 1 % IJ SOLN: 30.0000 mL | INTRAMUSCULAR | Status: DC | PRN

## 2024-02-20 MED ORDER — FENTANYL CITRATE (PF) 100 MCG/2ML IJ SOLN
50.0000 ug | INTRAMUSCULAR | Status: DC | PRN
  Administered 2024-02-20 (×2): 100 ug via INTRAVENOUS
  Filled 2024-02-20 (×2): qty 2

## 2024-02-20 MED ORDER — IBUPROFEN 600 MG PO TABS
600.0000 mg | ORAL_TABLET | Freq: Four times a day (QID) | ORAL | Status: DC
  Administered 2024-02-21 – 2024-02-22 (×6): 600 mg via ORAL
  Filled 2024-02-20 (×6): qty 1

## 2024-02-20 MED ORDER — OXYTOCIN-SODIUM CHLORIDE 30-0.9 UT/500ML-% IV SOLN
1.0000 m[IU]/min | INTRAVENOUS | Status: DC
  Administered 2024-02-20: 2 m[IU]/min via INTRAVENOUS
  Administered 2024-02-20: 11 m[IU]/min via INTRAVENOUS
  Filled 2024-02-20: qty 500

## 2024-02-20 MED ORDER — TERBUTALINE SULFATE 1 MG/ML IJ SOLN: 0.2500 mg | Freq: Once | INTRAMUSCULAR | Status: DC | PRN

## 2024-02-20 MED ORDER — ZOLPIDEM TARTRATE 5 MG PO TABS: 5.0000 mg | ORAL_TABLET | Freq: Every evening | ORAL | Status: DC | PRN

## 2024-02-20 MED ORDER — COCONUT OIL OIL: 1.0000 | TOPICAL_OIL | Status: DC | PRN

## 2024-02-20 MED ORDER — LACTATED RINGERS IV SOLN: INTRAVENOUS | Status: DC

## 2024-02-20 MED ORDER — LACTATED RINGERS IV SOLN
500.0000 mL | Freq: Once | INTRAVENOUS | Status: AC
  Administered 2024-02-20: 500 mL via INTRAVENOUS

## 2024-02-20 MED ORDER — MISOPROSTOL 25 MCG QUARTER TABLET
25.0000 ug | ORAL_TABLET | Freq: Once | ORAL | Status: AC
  Administered 2024-02-20: 25 ug via VAGINAL
  Filled 2024-02-20: qty 1

## 2024-02-20 MED ORDER — SIMETHICONE 80 MG PO CHEW: 80.0000 mg | CHEWABLE_TABLET | ORAL | Status: DC | PRN

## 2024-02-20 MED ORDER — TETANUS-DIPHTH-ACELL PERTUSSIS 5-2.5-18.5 LF-MCG/0.5 IM SUSY: 0.5000 mL | PREFILLED_SYRINGE | Freq: Once | INTRAMUSCULAR | Status: DC

## 2024-02-20 MED ORDER — PRENATAL MULTIVITAMIN CH
1.0000 | ORAL_TABLET | Freq: Every day | ORAL | Status: DC
  Administered 2024-02-21: 1 via ORAL
  Filled 2024-02-20: qty 1

## 2024-02-20 MED ORDER — FLEET ENEMA RE ENEM
1.0000 | ENEMA | RECTAL | Status: DC | PRN
Start: 2024-02-20 — End: 2024-02-20

## 2024-02-20 MED ORDER — LIDOCAINE HCL (PF) 1 % IJ SOLN
INTRAMUSCULAR | Status: DC | PRN
  Administered 2024-02-20 (×2): 5 mL via EPIDURAL

## 2024-02-20 MED ORDER — FENTANYL CITRATE (PF) 100 MCG/2ML IJ SOLN
INTRAMUSCULAR | Status: AC
  Administered 2024-02-20: 50 ug
  Filled 2024-02-20: qty 2

## 2024-02-20 MED ORDER — DIPHENHYDRAMINE HCL 25 MG PO CAPS: 25.0000 mg | ORAL_CAPSULE | Freq: Four times a day (QID) | ORAL | Status: DC | PRN

## 2024-02-20 MED ORDER — MISOPROSTOL 25 MCG QUARTER TABLET: 25.0000 ug | ORAL_TABLET | Freq: Once | ORAL | Status: DC

## 2024-02-20 MED ORDER — WITCH HAZEL-GLYCERIN EX PADS: 1.0000 | MEDICATED_PAD | CUTANEOUS | Status: DC | PRN

## 2024-02-20 MED ORDER — PHENYLEPHRINE 80 MCG/ML (10ML) SYRINGE FOR IV PUSH (FOR BLOOD PRESSURE SUPPORT): 80.0000 ug | PREFILLED_SYRINGE | INTRAVENOUS | Status: DC | PRN

## 2024-02-20 MED ORDER — ACETAMINOPHEN 325 MG PO TABS: 650.0000 mg | ORAL_TABLET | ORAL | Status: DC | PRN

## 2024-02-20 MED ORDER — MISOPROSTOL 50MCG HALF TABLET
50.0000 ug | ORAL_TABLET | Freq: Once | ORAL | Status: AC
  Administered 2024-02-20: 50 ug via ORAL
  Filled 2024-02-20: qty 1

## 2024-02-20 MED ORDER — EPHEDRINE 5 MG/ML INJ: 10.0000 mg | INTRAVENOUS | Status: DC | PRN

## 2024-02-20 MED ORDER — DIBUCAINE (PERIANAL) 1 % EX OINT: 1.0000 | TOPICAL_OINTMENT | CUTANEOUS | Status: DC | PRN

## 2024-02-20 MED ORDER — OXYTOCIN-SODIUM CHLORIDE 30-0.9 UT/500ML-% IV SOLN
2.5000 [IU]/h | INTRAVENOUS | Status: DC
  Administered 2024-02-20: 2.5 [IU]/h via INTRAVENOUS

## 2024-02-20 MED ORDER — MEASLES, MUMPS & RUBELLA VAC IJ SOLR: 0.5000 mL | Freq: Once | INTRAMUSCULAR | Status: DC

## 2024-02-20 MED ORDER — ONDANSETRON HCL 4 MG/2ML IJ SOLN: 4.0000 mg | Freq: Four times a day (QID) | INTRAMUSCULAR | Status: DC | PRN

## 2024-02-20 MED ORDER — BENZOCAINE-MENTHOL 20-0.5 % EX AERO
1.0000 | INHALATION_SPRAY | CUTANEOUS | Status: DC | PRN
  Filled 2024-02-20: qty 56

## 2024-02-20 MED ORDER — ONDANSETRON HCL 4 MG PO TABS: 4.0000 mg | ORAL_TABLET | ORAL | Status: DC | PRN

## 2024-02-20 MED ORDER — OXYTOCIN BOLUS FROM INFUSION
333.0000 mL | Freq: Once | INTRAVENOUS | Status: AC
  Administered 2024-02-20: 333 mL via INTRAVENOUS

## 2024-02-20 MED ORDER — MISOPROSTOL 25 MCG QUARTER TABLET
25.0000 ug | ORAL_TABLET | ORAL | Status: DC | PRN
  Administered 2024-02-20: 25 ug via ORAL
  Filled 2024-02-20: qty 1

## 2024-02-20 MED ORDER — FERROUS SULFATE 325 (65 FE) MG PO TABS
325.0000 mg | ORAL_TABLET | ORAL | Status: DC
  Administered 2024-02-21: 325 mg via ORAL
  Filled 2024-02-20: qty 1

## 2024-02-20 MED ORDER — SOD CITRATE-CITRIC ACID 500-334 MG/5ML PO SOLN: 30.0000 mL | ORAL | Status: DC | PRN

## 2024-02-20 MED ORDER — OXYCODONE HCL 5 MG PO TABS: 5.0000 mg | ORAL_TABLET | ORAL | Status: DC | PRN

## 2024-02-20 NOTE — Discharge Summary (Signed)
 Postpartum Discharge Summary  Date of Service updated***     Patient Name: Traci Campbell DOB: [birth year] MRN: [id number]  Date of admission: 02/20/2024 Delivery date:02/20/2024 Delivering provider: LORELI IHA D Date of discharge: 02/20/2024  Admitting diagnosis: Post-dates pregnancy [O48.0] Intrauterine pregnancy: [redacted]w[redacted]d     Secondary diagnosis:  Principal Problem:   Post-dates pregnancy Active Problems:   LGSIL on Pap smear of cervix   Placenta succenturiate lobe affecting fetus  Additional problems: none    Discharge diagnosis: Term Pregnancy Delivered                                              Post partum procedures:none Augmentation: AROM, Pitocin , Cytotec , and IP Foley Complications: None  Hospital course: Induction of Labor With Vaginal Delivery   28 y.o. yo G1P1001 at [redacted]w[redacted]d was admitted to the hospital 02/20/2024 for induction of labor.  Indication for induction: Postdates.  Patient had a uncomplicated labor course.  Membrane Rupture Time/Date: 9:40 AM,02/20/2024  Delivery Method:Vaginal, Spontaneous Operative Delivery:N/A Episiotomy: None Lacerations:  1st degree;Perineal Details of delivery can be found in separate delivery note.  Patient had a postpartum course complicated by***. Patient is discharged home 02/20/24.  Newborn Data: Birth date:02/20/2024 Birth time:8:45 PM Gender:Female Living status:Living Apgars:8 ,9  Weight:3060 g (6lb 11.9oz)  Magnesium Sulfate received: No BMZ received: No Rhophylac:N/A MMR:N/A T-DaP:Given prenatally Flu: Yes RSV Vaccine received: No Transfusion:No  Immunizations received: Immunization History  Administered Date(s) Administered   Fluzone  Influenza virus vaccine,trivalent (IIV3), split virus 06/30/2016   Influenza, Seasonal, Injecte, Preservative Fre 08/10/2023   Influenza,inj,Quad PF,6+ Mos 06/30/2016   Influenza,inj,Quad PF,6-35 Mos 03/26/2020   Tdap 12/17/2023    Physical exam  Vitals:   02/20/24 2135  02/20/24 2145 02/20/24 2200 02/20/24 2215  BP: (!) 153/70 119/77 (!) 123/59 123/75  Pulse: (!) 103 (!) 102 (!) 112 (!) 105  Resp:      Temp:      TempSrc:      Weight:      Height:       General: {Exam; general:21111117} Lochia: {Desc; appropriate/inappropriate:30686::appropriate} Uterine Fundus: {Desc; firm/soft:30687} Incision: {Exam; incision:21111123} DVT Evaluation: {Exam; dvt:2111122} Labs: Lab Results  Component Value Date   WBC 11.3 (H) 02/20/2024   HGB 9.1 (L) 02/20/2024   HCT 29.1 (L) 02/20/2024   MCV 83.6 02/20/2024   PLT 309 02/20/2024      Latest Ref Rng & Units 08/10/2023   10:29 AM  CMP  Glucose 70 - 99 mg/dL 68   BUN 6 - 20 mg/dL 11   Creatinine 9.42 - 1.00 mg/dL 9.47   Sodium 865 - 855 mmol/L 134   Potassium 3.5 - 5.2 mmol/L 4.0   Chloride 96 - 106 mmol/L 99   CO2 20 - 29 mmol/L 20   Calcium 8.7 - 10.2 mg/dL 9.1   Total Protein 6.0 - 8.5 g/dL 7.1   Total Bilirubin 0.0 - 1.2 mg/dL 0.6   Alkaline Phos 44 - 121 IU/L 44   AST 0 - 40 IU/L 13   ALT 0 - 32 IU/L 6    Edinburgh Score:     No data to display         No data recorded  After visit meds:  Allergies as of 02/20/2024   No Known Allergies   Med Rec must be completed prior to using this  SMARTLINK***        Discharge home in stable condition Infant Feeding: {Baby feeding:23562} Infant Disposition:{CHL IP OB HOME WITH FNUYZM:76418} Discharge instruction: per After Visit Summary and Postpartum booklet. Activity: Advance as tolerated. Pelvic rest for 6 weeks.  Diet: routine diet Future Appointments: Future Appointments  Date Time Provider Department Center  03/06/2024 10:45 AM [facility] HEALTH CLINICIAN [facility]   Follow up Visit:  Loreli Suzen BIRCH, CNM  P [facility] Admin This patient is a Mom+Baby Combined care infant. The patient and baby need the following appointments scheduled:  Infant needs a newborn weight check -- 2-3 days from discharge. Approximate date  of d/c; 02/22/24 Infant needs a 2 week weight check Infant needs 1 month Well Child Check  Thank you!  Please schedule this patient for Postpartum visit in: 6 weeks with the following provider: Any provider In-Person For C/S patients schedule nurse incision check in weeks 2 weeks: no Low risk pregnancy complicated by: postdates; hx abnl Pap Delivery mode:  SVD Anticipated Birth Control:  other/unsure PP Procedures needed: Colpo Edinburgh: not completed yet Schedule Integrated BH visit: no No known relevant baby issues  02/20/2024 Suzen BIRCH Loreli, CNM

## 2024-02-20 NOTE — Anesthesia Preprocedure Evaluation (Signed)
 Anesthesia Evaluation  Patient identified by MRN, date of birth, ID band Patient awake    Reviewed: Allergy & Precautions, H&P , NPO status , Patient's Chart, lab work & pertinent test results  Airway Mallampati: II  TM Distance: >3 FB Neck ROM: Full    Dental no notable dental hx.    Pulmonary neg pulmonary ROS   Pulmonary exam normal breath sounds clear to auscultation       Cardiovascular negative cardio ROS Normal cardiovascular exam Rhythm:Regular Rate:Normal     Neuro/Psych  PSYCHIATRIC DISORDERS Anxiety Depression    negative neurological ROS     GI/Hepatic negative GI ROS, Neg liver ROS,,,  Endo/Other  negative endocrine ROS    Renal/GU negative Renal ROS  negative genitourinary   Musculoskeletal negative musculoskeletal ROS (+)    Abdominal   Peds negative pediatric ROS (+)  Hematology negative hematology ROS (+)   Anesthesia Other Findings IOL for post dates   Reproductive/Obstetrics (+) Pregnancy                              Anesthesia Physical Anesthesia Plan  ASA: 2  Anesthesia Plan: Epidural   Post-op Pain Management:    Induction:   PONV Risk Score and Plan: Treatment may vary due to age or medical condition  Airway Management Planned: Natural Airway  Additional Equipment:   Intra-op Plan:   Post-operative Plan:   Informed Consent: I have reviewed the patients History and Physical, chart, labs and discussed the procedure including the risks, benefits and alternatives for the proposed anesthesia with the patient or authorized representative who has indicated his/her understanding and acceptance.       Plan Discussed with: Anesthesiologist  Anesthesia Plan Comments: (Patient identified. Risks, benefits, options discussed with patient including but not limited to bleeding, infection, nerve damage, paralysis, failed block, incomplete pain control, headache,  blood pressure changes, nausea, vomiting, reactions to medication, itching, and post partum back pain. Confirmed with bedside nurse the patient's most recent platelet count. Confirmed with the patient that they are not taking any anticoagulation, have any bleeding history or any family history of bleeding disorders. Patient expressed understanding and wishes to proceed. All questions were answered. )        Anesthesia Quick Evaluation

## 2024-02-20 NOTE — Progress Notes (Signed)
 Forebag appreciated by RN staff after previous AROM.  RBA of repeat AROM d/w patient, consent obtained.   Dilation: 4 Effacement (%): 80, 90 Cervical Position: Posterior Station: -1 Presentation: Vertex Exam by:: Camie Rote, CNM  AROM conducted, small amount of clear fluid noted. Patient and fetus tolerated well. Will start pitocin  2x2 to continue IOL.  Camie Rote, MSN, CNM, RNC-OB Certified Nurse Midwife, [facility] 02/20/2024 1:15 PM

## 2024-02-20 NOTE — Progress Notes (Signed)
 Labor Progress Note Traci Campbell is a 28 y.o. G1P0000 at [redacted]w[redacted]d presented for IOL 2/2 PD  S:  Coping well.   O:  BP 115/69   Pulse 97   Temp 98.3 F (36.8 C) (Axillary)   Resp 14   Ht 5' 8 (1.727 m)   Wt 85.4 kg   LMP 05/02/2023 (Exact Date)   BMI 28.62 kg/m   EFM: baseline 135 bpm/ moderate variability/ 15x15 accels/ occasional variable decels  Toco/IUPC: 1.5-8 SVE: Dilation: 4 Effacement (%): 70, 80 Station: -1 Presentation: Vertex Exam by:: Camie Rote, CNM Pitocin : 0 mu/min  A/P: 28 y.o. G1P0000 [redacted]w[redacted]d  1. Labor: IOL in latent phase s/p FB and cytotec . FB out on toilet. RBA  of AROM d/w patient. Patient agrees, AROM, clear fluid, bloody show. Patient and fetus tolerated well.  Cytotec  at 0900, consider initiation of pitocin  at 1300.  2. FWB: Cat 2, reassuring overall.  3. Pain: Coping well. Epidural on request.  4. GBS negative   Anticipate NVSB.  Camie DELENA Rote, CNM 9:48 AM

## 2024-02-20 NOTE — Lactation Note (Signed)
 This note was copied from a baby's chart. Lactation Consultation Note  Patient Name: Girl Trystin Hargrove Unijb'd Date: 02/20/2024 Age:30 hours  Attempted to see mom. Mom holding baby STS, baby sleeping. Mom stated she fed a little bit about 30 min. Ago. Mom will call for Lactation for next feeding.   Maternal Data    Feeding    LATCH Score Latch: Repeated attempts needed to sustain latch, nipple held in mouth throughout feeding, stimulation needed to elicit sucking reflex.  Audible Swallowing: None  Type of Nipple: Everted at rest and after stimulation (edematous)  Comfort (Breast/Nipple): Soft / non-tender  Hold (Positioning): Assistance needed to correctly position infant at breast and maintain latch.  LATCH Score: 6   Lactation Tools Discussed/Used    Interventions    Discharge    Consult Status      Tanishka Drolet G 02/20/2024, 11:50 PM

## 2024-02-20 NOTE — Anesthesia Procedure Notes (Signed)
 Epidural Patient location during procedure: OB Start time: 02/20/2024 4:46 PM End time: 02/20/2024 4:56 PM  Staffing Anesthesiologist: Erma Thom SAUNDERS, MD Performed: anesthesiologist   Preanesthetic Checklist Completed: patient identified, IV checked, risks and benefits discussed, monitors and equipment checked, pre-op evaluation and timeout performed  Epidural Patient position: sitting Prep: DuraPrep Patient monitoring: heart rate, cardiac monitor, continuous pulse ox and blood pressure Approach: midline Location: L3-L4 Injection technique: LOR air  Needle:  Needle type: Tuohy  Needle gauge: 17 G Needle length: 9 cm Needle insertion depth: 5 cm Catheter type: closed end flexible Catheter size: 19 Gauge Catheter at skin depth: 10 cm Test dose: negative  Assessment Sensory level: T8  Additional Notes Patient identified. Risks/Benefits/Options discussed with patient including but not limited to bleeding, infection, nerve damage, paralysis, failed block, incomplete pain control, headache, blood pressure changes, nausea, vomiting, reactions to medication both or allergic, itching and postpartum back pain. Confirmed with bedside nurse the patient's most recent platelet count. Confirmed with patient that they are not currently taking any anticoagulation, have any bleeding history or any family history of bleeding disorders. Patient expressed understanding and wished to proceed. All questions were answered. Sterile technique was used throughout the entire procedure. Please see nursing notes for vital signs. Test dose was given through epidural catheter and negative prior to continuing to dose epidural or start infusion. Warning signs of high block given to the patient including shortness of breath, tingling/numbness in hands, complete motor block, or any concerning symptoms with instructions to call for help. Patient was given instructions on fall risk and not to get out of bed. All questions and  concerns addressed with instructions to call with any issues or inadequate analgesia.  Reason for block:procedure for pain

## 2024-02-20 NOTE — H&P (Signed)
 OBSTETRIC ADMISSION HISTORY AND PHYSICAL  Traci Campbell is a 28 y.o. female G1P0000 with IUP at [redacted]w[redacted]d by LMP c/w 7wk US  presenting for IOL for postdates. She reports +FMs, No LOF, no VB, no blurry vision, headaches or peripheral edema, and RUQ pain.  She plans on breast feeding. She is undecided regarding birth control. She received her prenatal [facility] at [facility]   Dating: By LMP --->  Estimated Date of Delivery: 02/13/24  Sono:    @[redacted]w[redacted]d , CWD, normal anatomy, vertex presentation, succenturiate lobe placenta,  2733g, 22% EFW   Prenatal History/Complications: depression, twin pregnancy--vanishing twin, succenturiate lobe placenta, LGSIL on pap smear  Past Medical History: Past Medical History:  Diagnosis Date   Alcohol abuse 07/24/2022   Depression    Generalized anxiety disorder 08/10/2023   Insomnia    Moderate major depression, single episode (HCC) 08/10/2023   Vaginal Pap smear, abnormal     Past Surgical History: Past Surgical History:  Procedure Laterality Date   COLPOSCOPY      Obstetrical History: OB History     Gravida  1   Para  0   Term  0   Preterm  0   AB  0   Living  0      SAB  0   IAB  0   Ectopic  0   Multiple  0   Live Births  0           Social History Social History   Socioeconomic History   Marital status: Single    Spouse name: Not on file   Number of children: Not on file   Years of education: Not on file   Highest education level: Not on file  Occupational History   Occupation: flight attendant  Tobacco Use   Smoking status: Never   Smokeless tobacco: Never  Vaping Use   Vaping status: Never Used  Substance and Sexual Activity   Alcohol use: Not Currently    Comment: 3 times a week   Drug use: Not Currently    Types: Cocaine    Comment: 08/07/23 brief episode 02/2023,not current   Sexual activity: Yes    Birth control/protection: None  Other Topics Concern   Not on file  Social History Narrative   Not on file    Social Drivers of Health   Financial Resource Strain: Not on file  Food Insecurity: No Food Insecurity (08/10/2023)   Hunger Vital Sign    Worried About Running Out of Food in the Last Year: Never true    Ran Out of Food in the Last Year: Never true  Transportation Needs: No Transportation Needs (08/10/2023)   PRAPARE - Administrator, Civil Service (Medical): No    Lack of Transportation (Non-Medical): No  Physical Activity: Not on file  Stress: Not on file  Social Connections: Not on file    Family History: Family History  Problem Relation Age of Onset   Hypertension Mother    Pulmonary embolism Mother    Hypertension Father    Colon cancer Maternal Uncle 40   Sickle cell anemia Half-Sister     Allergies: No Known Allergies  Medications Prior to Admission  Medication Sig Dispense Refill Last Dose/Taking   aspirin  81 MG chewable tablet Chew 2 tablets (162 mg total) by mouth daily. 180 tablet 3    Blood Pressure Monitoring (BLOOD PRESSURE KIT) DEVI 1 Device by Does not apply route as needed. 1 each 0  cimetidine  (CIMETIDINE  200) 200 MG tablet Take 1 tablet (200 mg total) by mouth 2 (two) times daily. (Patient not taking: Reported on 02/04/2024)      ferrous sulfate  325 (65 FE) MG EC tablet Take 1 tablet (325 mg total) by mouth every other day. 30 tablet 2    metroNIDAZOLE  (FLAGYL ) 500 MG tablet Take 1 tablet (500 mg total) by mouth 2 (two) times daily. 14 tablet 0    Prenatal Vit-Fe Fumarate-FA (PRENATAL VITAMINS PO) Take 1 tablet by mouth daily at 6 (six) AM.        Review of Systems   All systems reviewed and negative except as stated in HPI  Last menstrual period 05/02/2023. General appearance: alert, cooperative, and appears stated age Lungs: clear to auscultation bilaterally Heart: regular rate and rhythm Abdomen: soft, non-tender; bowel sounds normal Pelvic: 1/70/-2 Presentation: cephalic by RN BSUS Fetal monitoringBaseline: 125 bpm, Variability:  Good {> 6 bpm), Accelerations: Reactive, and Decelerations: Absent Uterine activity, not picking up well on monitor    Prenatal labs: ABO, Rh: A/Positive/-- (02/21 1029) Antibody: Negative (02/21 1029) Rubella: 1.95 (02/21 1029) RPR: Non Reactive (06/03 0905)  HBsAg: Negative (02/21 1029)  HIV: Non Reactive (06/03 0905)  GBS: Negative/-- (08/04 1246)    Lab Results  Component Value Date   GBS Negative 01/21/2024   GTT passed Genetic screening  atypical finding suspicious for vanishing twin Anatomy US : normal  Immunization History  Administered Date(s) Administered   Fluzone  Influenza virus vaccine,trivalent (IIV3), split virus 06/30/2016   Influenza, Seasonal, Injecte, Preservative Fre 08/10/2023   Influenza,inj,Quad PF,6+ Mos 06/30/2016   Influenza,inj,Quad PF,6-35 Mos 03/26/2020   Tdap 12/17/2023    Prenatal Transfer Tool  Maternal Diabetes: No Genetic Screening: Abnormal:  Results: Other: atypical finding: vanishing twin. Horizon--negative. AFP neg Maternal Ultrasounds/Referrals: Normal Fetal Ultrasounds or other Referrals:  None Maternal Substance Abuse:  No Significant Maternal Medications:  None Significant Maternal Lab Results: Group B Strep negative Number of Prenatal Visits:greater than 3 verified prenatal visits Maternal Vaccinations:TDap Other Comments:  None   Results for orders placed or performed during the hospital encounter of 02/20/24 (from the past 24 hours)  CBC   Collection Time: 02/20/24  1:12 AM  Result Value Ref Range   WBC 11.3 (H) 4.0 - 10.5 K/uL   RBC 3.48 (L) 3.87 - 5.11 MIL/uL   Hemoglobin 9.1 (L) 12.0 - 15.0 g/dL   HCT 70.8 (L) 63.9 - 53.9 %   MCV 83.6 80.0 - 100.0 fL   MCH 26.1 26.0 - 34.0 pg   MCHC 31.3 30.0 - 36.0 g/dL   RDW 83.9 (H) 88.4 - 84.4 %   Platelets 309 150 - 400 K/uL   nRBC 0.0 0.0 - 0.2 %  Type and screen [facility]   Collection Time: 02/20/24  1:12 AM  Result Value Ref Range   ABO/RH(D) A POS     Antibody Screen NEG    Sample Expiration      02/23/2024,2359 Performed at [facility], [address] [place], [place] [address]     Patient Active Problem List   Diagnosis Date Noted   Post-dates pregnancy 02/20/2024   Placenta succenturiate lobe affecting fetus 12/05/2023   LGSIL on Pap smear of cervix 08/15/2023   Major depressive disorder, single episode, moderate (HCC) 08/10/2023   Supervision of high risk pregnancy, antepartum 08/07/2023   Twin pregnancy--Vanishing twin 08/07/2023    Assessment/Plan:  Traci Campbell is a 28 y.o. G1P0000 at  [redacted]w[redacted]d here for postdates induction  #Labor:latent, s/p cytotec  & cervical balloon now in place #Pain: PRN fentanyl , epidural when desired #FWB: Cat I #GBS status:  negative #Feeding: Breastmilk  #Reproductive Life planning: Undecided #Circ:  not applicable  Traci KATHEE Pouch, MD  02/20/2024, 12:28 AM

## 2024-02-21 MED ORDER — INFLUENZA VIRUS VACC SPLIT PF (FLUZONE) 0.5 ML IM SUSY
0.5000 mL | PREFILLED_SYRINGE | INTRAMUSCULAR | Status: AC
  Administered 2024-02-22: 0.5 mL via INTRAMUSCULAR
  Filled 2024-02-21: qty 0.5

## 2024-02-21 NOTE — Progress Notes (Signed)
 Post Partum Day 1 Subjective: no complaints, up ad lib, voiding, and tolerating PO  Objective: Blood pressure 114/62, pulse 100, temperature 98.5 F (36.9 C), temperature source Oral, resp. rate 16, height 5' 8 (1.727 m), weight 85.4 kg, last menstrual period 05/02/2023, SpO2 98%, unknown if currently breastfeeding.  Physical Exam:  General: alert, cooperative, and no distress Lochia: appropriate Uterine Fundus: firm Incision: NA DVT Evaluation: No evidence of DVT seen on physical exam.  Recent Labs    02/20/24 0112  HGB 9.1*  HCT 29.1*    Assessment/Plan: Plan for discharge tomorrow, Breastfeeding, Lactation consult, and Contraception OP Depo   LOS: 1 day   Traci Campbell  Traci Campbell, CNM 02/21/2024, 10:29 AM

## 2024-02-21 NOTE — Lactation Note (Signed)
 This note was copied from a baby's chart. Lactation Consultation Note  Patient Name: Traci Campbell Date: 02/21/2024 Age:30 hours Reason for consult: Initial assessment;Primapara;Term Mom called wanting assistance feeding. LC assisted in getting baby awake and getting her latched. Attempted to latch in cross cradle position but baby not interested. Stimulated baby more and placed in football position. Baby finally latched and was BF well when left. Heard occasional swallow. Mom happy. Newborn feeding habits, STS, I&O, positioning, body alignment, support and props reviewed. Mom encouraged to feed baby 8-12 times/24 hours and with feeding cues. Praised mom for good feeding. Encouraged mom to call for assistance as needed.  Maternal Data Has patient been taught Hand Expression?: Yes Does the patient have breastfeeding experience prior to this delivery?: No  Feeding    LATCH Score Latch: Grasps breast easily, tongue down, lips flanged, rhythmical sucking.  Audible Swallowing: A few with stimulation  Type of Nipple: Everted at rest and after stimulation  Comfort (Breast/Nipple): Soft / non-tender  Hold (Positioning): Assistance needed to correctly position infant at breast and maintain latch.  LATCH Score: 8   Lactation Tools Discussed/Used    Interventions Interventions: Breast feeding basics reviewed;Assisted with latch;Skin to skin;Breast massage;Hand express;Breast compression;Adjust position;Position options;Support pillows;Education;LC Services brochure;CDC milk storage guidelines  Discharge Discharge Education: Outpatient recommendation Pump: DEBP  Consult Status Consult Status: Follow-up Date: 02/22/24 Follow-up type: In-patient    [name] G 02/21/2024, 6:29 AM

## 2024-02-21 NOTE — Anesthesia Postprocedure Evaluation (Signed)
 Anesthesia Post Note  Patient: Traci Campbell  Procedure(s) Performed: AN AD HOC LABOR EPIDURAL     Patient location during evaluation: Mother Baby Anesthesia Type: Epidural Level of consciousness: awake Pain management: satisfactory to patient Vital Signs Assessment: post-procedure vital signs reviewed and stable Respiratory status: spontaneous breathing Cardiovascular status: stable Anesthetic complications: no   No notable events documented.  Last Vitals:  Vitals:   02/20/24 2356 02/21/24 0403  BP: 127/82 114/60  Pulse: (!) 101 (!) 104  Resp: 17 16  Temp: 36.8 C 37 C  SpO2:  100%    Last Pain:  Vitals:   02/21/24 0530  TempSrc:   PainSc: 2    Pain Goal:                   [employer]

## 2024-02-22 LAB — CBC
HCT: 26.3 % — ABNORMAL LOW (ref 36.0–46.0)
Hemoglobin: 8.3 g/dL — ABNORMAL LOW (ref 12.0–15.0)
MCH: 26.1 pg (ref 26.0–34.0)
MCHC: 31.6 g/dL (ref 30.0–36.0)
MCV: 82.7 fL (ref 80.0–100.0)
Platelets: 272 K/uL (ref 150–400)
RBC: 3.18 MIL/uL — ABNORMAL LOW (ref 3.87–5.11)
RDW: 16.1 % — ABNORMAL HIGH (ref 11.5–15.5)
WBC: 13.5 K/uL — ABNORMAL HIGH (ref 4.0–10.5)
nRBC: 0 % (ref 0.0–0.2)

## 2024-02-22 MED ORDER — ACETAMINOPHEN 325 MG PO TABS: 650.0000 mg | ORAL_TABLET | ORAL | Status: AC | PRN

## 2024-02-22 MED ORDER — IBUPROFEN 600 MG PO TABS: 600.0000 mg | ORAL_TABLET | Freq: Four times a day (QID) | ORAL | Status: AC

## 2024-02-22 NOTE — Lactation Note (Signed)
 This note was copied from a baby's chart. Lactation Consultation Note  Patient Name: Traci Campbell Unijb'd Date: 02/22/2024 Age:30 hours Reason for consult: Follow-up assessment;Primapara;1st time breastfeeding;Term  P1- MOB reports that infant is nursing great so far. MOB denies having any questions or concerns. LC provided MOB with coconut oil to put on her areola due to dryness in that area. LC reviewed cluster feeding, CDC milk storage guidelines, LC services handout and engorgement/breast care. LC encouraged MOB to call for further assistance as needed.  Maternal Data Has patient been taught Hand Expression?: No Does the patient have breastfeeding experience prior to this delivery?: No  Feeding Mother's Current Feeding Choice: Breast Milk  Lactation Tools Discussed/Used Pump Education: Milk Storage  Interventions Interventions: Breast feeding basics reviewed;Coconut oil;Education  Discharge Discharge Education: Engorgement and breast care;Warning signs for feeding baby Pump: Personal;DEBP  Consult Status Consult Status: Complete Date: 02/22/24    Recardo Hoit BS, IBCLC 02/22/2024, 11:18 AM

## 2024-02-22 NOTE — Patient Instructions (Signed)

## 2024-02-22 NOTE — Social Work (Signed)
 CSW received consult for hx of Anxiety and Depression.  CSW met with MOB to offer support and complete assessment. CSW entered the room and observed MOB having breakfast and holding the infant, and FOB at bedside. CSW introduced self, CSW role and reason for visit. MOB was agreeable to visit and allowed FOB to remain in the room. CSW inquired about MOB MH hx, MOB reported she was diagnosed with Anxiety and Depression about 2 years ago. CSW inquired about treatment, MOB reported she manages her symptoms with breathing techniques and finding something to occupy her mind. CSW assessed for safety, MOB denied any SI or HI. CSW provided education regarding the baby blues period vs. perinatal mood disorders, discussed treatment and gave resources for mental health follow up if concerns arise.  CSW recommends self-evaluation during the postpartum time period using the New Mom Checklist from Postpartum Progress and encouraged MOB to contact a medical professional if symptoms are noted at any time.  MOB identified her mom sister and FOB as her primary support.   CSW provided review of Sudden Infant Death Syndrome (SIDS) precautions.  MOB reported she has all essential items for the infant including a bassinet, crib and car seat. CSW identifies no further need for intervention and no barriers to discharge at this time.  Riyad Keena, LCSWA Clinical Social Worker [phone]

## 2024-02-22 NOTE — Lactation Note (Signed)
 This note was copied from a baby's chart. Lactation Consultation Note  Patient Name: Traci Campbell Date: 02/22/2024 Age:30 hours Reason for consult: Follow-up assessment;Term;Primapara  Mom was BF baby when LC came into rm. Encouraged mom to pull baby's body alignment up closer to mom.  Mom denies painful latches. Mom stated she is feeling good about BF. Asked mom abut the output. Mom stated the baby had 3 pee's and 3 poops yesterday. Encouraged mom to keep up with the output. Mom has no questions or concerns at this time.  Maternal Data Does the patient have breastfeeding experience prior to this delivery?: No  Feeding    LATCH Score Latch: Grasps breast easily, tongue down, lips flanged, rhythmical sucking.  Audible Swallowing: A few with stimulation  Type of Nipple: Everted at rest and after stimulation  Comfort (Breast/Nipple): Soft / non-tender  Hold (Positioning): No assistance needed to correctly position infant at breast.  LATCH Score: 9   Lactation Tools Discussed/Used    Interventions    Discharge    Consult Status Consult Status: Follow-up Date: 02/22/24 Follow-up type: In-patient    Courtnie Brenes G 02/22/2024, 5:06 AM

## 2024-02-25 DIAGNOSIS — Z0289 Encounter for other administrative examinations: Secondary | ICD-10-CM

## 2024-03-03 ENCOUNTER — Telehealth (HOSPITAL_COMMUNITY): Payer: Self-pay | Admitting: *Deleted

## 2024-03-03 NOTE — Telephone Encounter (Signed)
 03/03/2024  Name: Traci Campbell MRN: [id number] DOB: 1996-01-19  Reason for Call:  Transition of Care Hospital Discharge Call  Contact Status: Patient Contact Status: Complete  Language assistant needed: Interpreter Mode: Interpreter Not Needed        Follow-Up Questions: Do You Have Any Concerns About Your Health As You Heal From Delivery?: Yes What Concerns Do You Have About Your Health?: Patient notes what feels like a rash and mild swelling on one side of her perineum/labia. Advised patient to call provider if this does not resolve on its own in next couple of days.  Encouraged use of peribottle to dilute urine and to cleanse area after toileting. Do You Have Any Concerns About Your Infants Health?: No  Edinburgh Postnatal Depression Scale:  In the Past 7 Days: I have been able to laugh and see the funny side of things.: As much as I always could I have looked forward with enjoyment to things.: As much as I ever did I have blamed myself unnecessarily when things went wrong.: No, never I have been anxious or worried for no good reason.: Yes, sometimes I have felt scared or panicky for no good reason.: No, not at all Things have been getting on top of me.: No, I have been coping as well as ever I have been so unhappy that I have had difficulty sleeping.: Not at all I have felt sad or miserable.: No, not at all I have been so unhappy that I have been crying.: No, never The thought of harming myself has occurred to me.: Never Edinburgh Postnatal Depression Scale Total: 2  PHQ2-9 Depression Scale:     Discharge Follow-up: Edinburgh score requires follow up?: No Patient was advised of the following resources:: Support Group, Breastfeeding Support Group  Post-discharge interventions: Reviewed Newborn Safe Sleep Practices  Mliss Sieve, RN 03/03/2024 12:14

## 2024-03-04 ENCOUNTER — Telehealth: Payer: Self-pay

## 2024-03-04 NOTE — Telephone Encounter (Signed)
 RN called patient to inform her that Declaration of Birth paperwork that she submitted has been completed and may be picked up during office ours at her convenience.  Pt gave permission for paperwork to be picked up by patient herself or [name], her significant other.  PT verbalized understanding.    [place], RN

## 2024-03-05 NOTE — BH Specialist Note (Unsigned)
 Integrated Behavioral Health via Telemedicine Visit  03/06/2024 Traci Campbell [id number]  Number of Integrated Behavioral Health Clinician visits: 3- Third Visit  Session Start time: 1046   Session End time: 1123  Total time in minutes: 37   Referring Provider: Norleen Rover, MD Patient/Family location: Home [facility] Provider location: [facility]  All persons participating in visit: Patient agrees with treatment plan.  Types of Service: Individual psychotherapy and Video visit  I connected with Traci Campbell and/or Traci Campbell's n/a via  Telephone or Video Enabled Telemedicine Application  (Video is Caregility application) and verified that I am speaking with the correct person using two identifiers. Discussed confidentiality: Yes   I discussed the limitations of telemedicine and the availability of in person appointments.  Discussed there is a possibility of technology failure and discussed alternative modes of communication if that failure occurs.  I discussed that engaging in this telemedicine visit, they consent to the provision of behavioral healthcare and the services will be billed under their insurance.  Patient and/or legal guardian expressed understanding and consented to Telemedicine visit: Yes   Presenting Concerns: Patient and/or family reports the following symptoms/concerns: Anxiety, poor sleep quality, fatigue, irritability; attributes to adjusting to new motherhood; pt is coping with support of her partner on paternity leave.  Duration of problem: Postpartum; Severity of problem: moderate  Patient and/or Family's Strengths/Protective Factors: Social connections, Concrete supports in place (healthy food, safe environments, etc.), and Sense of purpose  Goals Addressed: Patient will:  Reduce symptoms of: anxiety and insomnia   Increase knowledge and/or ability of: healthy habits   Demonstrate ability to: Increase  healthy adjustment to current life circumstances, Increase adequate support systems for patient/family, and Increase motivation to adhere to plan of care  Progress towards Goals: Ongoing    Interventions: Interventions utilized:  Functional Assessment of ADLs, Psychoeducation and/or Health Education, Link to [employer], and Supportive Reflection Standardized Assessments completed: GAD-7 and PHQ 9    Patient and/or Family Response: Patient agrees with treatment plan.   Clinical Assessment/Diagnosis  Adjustment disorder with anxious mood    Patient may benefit from continued therapeutic intervention.  Plan: Follow up with behavioral health clinician on : One month Behavioral recommendations:  -Continue taking prenatal vitamin and iron pill as prescribed -Continue prioritizing healthy self-care (regular meals, adequate rest; allowing practical help from supportive friends and family; relaxation breathing as needed) until at least postpartum medical appointment -Consider new mom support group as needed at either [email or web address] or [email or web address]   Referral(s): Integrated Art gallery manager (In Clinic) and [employer]:  new mom support  I discussed the assessment and treatment plan with the patient and/or parent/guardian. They were provided an opportunity to ask questions and all were answered. They agreed with the plan and demonstrated an understanding of the instructions.   They were advised to call back or seek an in-person evaluation if the symptoms worsen or if the condition fails to improve as anticipated.  Warren BROCKS [place] Current, LCSW     03/06/2024   11:13 AM 01/21/2024    1:19 PM 08/10/2023   12:12 PM 11/22/2020    2:13 PM 09/06/2020    5:50 PM  Depression screen PHQ 2/9  Decreased Interest 1 0 0 0 0  Down, Depressed, Hopeless 1 1 0 0 0  PHQ - 2 Score 2 1 0 0 0  Altered sleeping 3 2 2  0 1  Tired, decreased energy 3 0  2 0 1  Change in  appetite 0 0 0 0 1  Feeling bad or failure about yourself  0 0 0 0 0  Trouble concentrating 0 0 0 0 0  Moving slowly or fidgety/restless 0 0 0 0 0  Suicidal thoughts 0 0 0 0 0  PHQ-9 Score 8 3 4  0 3  Difficult doing work/chores    Not difficult at all       03/06/2024   11:14 AM 01/21/2024    1:19 PM 08/10/2023   12:12 PM 11/22/2020    2:13 PM  GAD 7 : Generalized Anxiety Score  Nervous, Anxious, on Edge 3 2 1  0  Control/stop worrying 1 1 0 0  Worry too much - different things 1 1 0 0  Trouble relaxing 0 0 0 0  Restless 0 0 0 0  Easily annoyed or irritable 3 1 1  0  Afraid - awful might happen 0 0 0 0  Total GAD 7 Score 8 5 2  0  Anxiety Difficulty    Not difficult at all

## 2024-03-06 ENCOUNTER — Ambulatory Visit: Admitting: Clinical

## 2024-03-06 DIAGNOSIS — F4322 Adjustment disorder with anxiety: Secondary | ICD-10-CM

## 2024-03-06 NOTE — Patient Instructions (Signed)
 [facility] for [facility] at [facility] [address] [place], [place] [address] [phone] (main office) [phone] [facility] office)  New Parent Support Groups [email or web address]

## 2024-03-21 ENCOUNTER — Ambulatory Visit: Admitting: Family Medicine

## 2024-03-21 ENCOUNTER — Other Ambulatory Visit: Payer: Self-pay

## 2024-03-21 ENCOUNTER — Encounter: Payer: Self-pay | Admitting: Family Medicine

## 2024-03-21 DIAGNOSIS — F321 Major depressive disorder, single episode, moderate: Secondary | ICD-10-CM

## 2024-03-21 DIAGNOSIS — R87612 Low grade squamous intraepithelial lesion on cytologic smear of cervix (LGSIL): Secondary | ICD-10-CM

## 2024-03-21 NOTE — Progress Notes (Signed)
    GYNECOLOGY CLINIC COLPOSCOPY PROCEDURE NOTE  28 y.o. G1P1001 here for colposcopy for pap finding of:  Result Date Procedure Results Follow-ups  08/10/2023 Cytology - PAP( [place]) High risk HPV: Positive (A) Neisseria Gonorrhea: Negative Chlamydia: Negative Trichomonas: Negative HPV 16: Negative HPV 18 / 45: Negative Adequacy: Satisfactory for evaluation; transformation zone component PRESENT. Diagnosis: - Low grade squamous intraepithelial lesion (LSIL) (A) Microorganisms: Shift in flora suggestive of bacterial vaginosis Comment: Normal Reference Ranger Chlamydia - Negative Comment: Normal Reference Range Neisseria Gonorrhea - Negative Comment: Normal Reference Range Trichomonas - Negative Comment: Normal Reference Range HPV - Negative Comment: Normal Reference Range HPV 16- Negative Comment: Normal Reference Range HPV 16 18 45 -Negative   03/14/2018 Cytology - PAP Adequacy: Satisfactory for evaluation  endocervical/transformation zone component ABSENT. Diagnosis: NEGATIVE FOR INTRAEPITHELIAL LESIONS OR MALIGNANCY. Diagnosis: SHIFT IN FLORA SUGGESTIVE OF BACTERIAL VAGINOSIS. HPV: NOT DETECTED Material Submitted: CervicoVaginal Pap [ThinPrep Imaged]   06/30/2016 Cytology - PAP Adequacy: Satisfactory for evaluation  endocervical/transformation zone component ABSENT. (A) Diagnosis: ATYPICAL SQUAMOUS CELLS OF UNDETERMINED SIGNIFICANCE (ASC-US ). (A) HPV: DETECTED (A) Material Submitted: CervicoVaginal Pap [ThinPrep Imaged] (A)     Discussed role for HPV in cervical dysplasia, need for surveillance, nature of the procedure, and risks and benefits.  Pregnancy test: Lab Results  Component Value Date   PREGTESTUR POSITIVE (A) 06/18/2023    No Known Allergies  Patient given informed consent, signed copy in the chart, time out was performed.    Placed in lithotomy position. Cervix viewed with speculum and colposcope after application of acetic acid.   Colposcopy  Adequacy Cervix fully visualized: Yes  SCJ fully visualized: Yes  Colposcopy Findings no visible lesions  Corresponding biopsies were not obtained.    ECC specimen was not obtained.  All specimens were labeled and sent to pathology.  Hemostatic measures: None  Complications: none  Patient tolerated the procedure well.  OBGyn Exam  Colposcopy Impressions Normal  Plan Repeat pap with HPV testing in one year.   Patient was given post procedure instructions.  Will follow up pathology and manage accordingly; patient will be contacted with results and recommendations.  Routine preventative health maintenance measures emphasized.  Donnice CHRISTELLA Carolus, MD/MPH Attending Family Medicine Physician, [facility] [address] [facility], [facility]

## 2024-03-21 NOTE — Patient Instructions (Signed)

## 2024-03-21 NOTE — Progress Notes (Signed)
 Post Partum Visit Note  Traci Campbell is a 28 y.o. G25P1001 female who presents for a postpartum visit. She is 4 weeks postpartum following a normal spontaneous vaginal delivery.  I have fully reviewed the prenatal and intrapartum course. The delivery was at 41w gestational weeks.  Anesthesia: epidural. Postpartum course has been overall good. Feels like she currently has a hemorrhoid that is itching, no bleeding. Has some vaginal swelling around one week ago that went away with hydrocortisone cream. Feels like repair has healed well. Baby is doing well. Baby is feeding by both breast and bottle - Enfamil Neuropro. Bleeding no bleeding. Bowel function is normal. Bladder function is normal. Patient is not sexually active. Contraception method is none. Postpartum depression screening: negative.    Upstream - 03/21/24 1021       Pregnancy Intention Screening   Does the patient want to become pregnant in the next year? No    Does the patient's partner want to become pregnant in the next year? No    Would the patient like to discuss contraceptive options today? No      Contraception Wrap Up   Current Method Abstinence    End Method No Method - Other Reason   cycle tracking   Contraception Counseling Provided Yes    How was the end contraceptive method provided? Provided on site         The pregnancy intention screening data noted above was reviewed. Potential methods of contraception were discussed. The patient elected to proceed with No Method - Other Reason (cycle tracking).   Edinburgh Postnatal Depression Scale - 03/21/24 1020       Edinburgh Postnatal Depression Scale:  In the Past 7 Days   I have been able to laugh and see the funny side of things. 0    I have looked forward with enjoyment to things. 0    I have blamed myself unnecessarily when things went wrong. 0    I have been anxious or worried for no good reason. 2    I have felt scared or panicky for no good reason. 0     Things have been getting on top of me. 0    I have been so unhappy that I have had difficulty sleeping. 0    I have felt sad or miserable. 0    I have been so unhappy that I have been crying. 0    The thought of harming myself has occurred to me. 0    Edinburgh Postnatal Depression Scale Total 2          Health Maintenance Due  Topic Date Due   Hepatitis B Vaccines 19-59 Average Risk (1 of 3 - 19+ 3-dose series) Never done   HPV VACCINES (1 - 3-dose SCDM series) Never done   COVID-19 Vaccine (1 - 2024-25 season) Never done    The following portions of the patient's history were reviewed and updated as appropriate: allergies, current medications, past family history, past medical history, past social history, past surgical history, and problem list.  Review of Systems Pertinent items noted in HPI and remainder of comprehensive ROS otherwise negative.  Objective:  BP 122/87   Pulse 90   Wt 157 lb (71.2 kg)   LMP 05/02/2023 (Exact Date)   Breastfeeding Yes   BMI 23.87 kg/m    General:  alert, cooperative, and appears stated age   Breasts:  not indicated  Lungs: Comfortalbe on room air  Wound Completely  healed perineum. Some loose strands of monocryl removed without need for cutting  GU exam:  Normal, cervix unremarkable. No unusual discharge. See colpo note        Assessment:   Postpartum care and examination  LGSIL on Pap smear of cervix  Major depressive disorder, single episode, moderate (HCC)  Normal postpartum exam.   Plan:   Essential components of care per ACOG recommendations:  1.  Mood and well being: Patient with negative depression screening today. Reviewed local resources for support.  - Patient tobacco use? No.   - hx of drug use? No.    2. Infant care and feeding:  -Patient currently breastmilk feeding? Yes. Reviewed importance of draining breast regularly to support lactation.  -Social determinants of health (SDOH) reviewed in EPIC. No  concerns  3. Sexuality, contraception and birth spacing - Patient does not want a pregnancy in the next year.  Desired family size is 2 children.  - Reviewed reproductive life planning. Reviewed contraceptive methods based on pt preferences and effectiveness.  Patient desired fertility tracking today. Counseled to have backup method until she has at least 2-3 cycles to reliably start tracking.   - Discussed birth spacing of 18 months  4. Sleep and fatigue -Encouraged family/partner/community support of 4 hrs of uninterrupted sleep to help with mood and fatigue  5. Physical Recovery  - Discussed patients delivery and complications. She describes her labor as good. - Patient had a Vaginal, no problems at delivery. Patient had a 1st degree laceration. Perineal healing reviewed. Patient expressed understanding - Patient has urinary incontinence? No. - Patient is safe to resume physical and sexual activity  6.  Health Maintenance - HM due items addressed Yes - Last pap smear  Diagnosis  Date Value Ref Range Status  08/10/2023 - Low grade squamous intraepithelial lesion (LSIL) (A)  Final   Pap smear not done at today's visit>colpo done -Breast Cancer screening indicated? No.   7. Chronic Disease/Pregnancy Condition follow up:  1. Postpartum care and examination   2. LGSIL on Pap smear of cervix   3. Major depressive disorder, single episode, moderate (HCC)    2. See colpo note from today  - PCP follow up   Traci CHRISTELLA Carolus, MD/MPH Attending Family Medicine Physician, [facility], [facility]

## 2024-03-24 ENCOUNTER — Ambulatory Visit: Admitting: Clinical

## 2024-03-24 DIAGNOSIS — F4322 Adjustment disorder with anxiety: Secondary | ICD-10-CM

## 2024-03-24 DIAGNOSIS — F4321 Adjustment disorder with depressed mood: Secondary | ICD-10-CM

## 2024-03-24 NOTE — BH Specialist Note (Signed)
 Integrated Behavioral Health via Telemedicine Visit  03/24/2024 Traci Campbell [id number]  Number of Integrated Behavioral Health Clinician visits: 3- Third Visit  Session Start time: 1046   Session End time: 1123  Total time in minutes: 37  Referring Provider: Norleen Rover, MD Patient/Family location: Home [facility] Provider location: [facility]  All persons participating in visit: Patient Traci Campbell and [facility] Gianah Batt   Types of Service: Individual psychotherapy and Video visit  I connected with Traci Campbell and/or Traci Campbell's n/a via  Telephone or Video Enabled Telemedicine Application  (Video is Caregility application) and verified that I am speaking with the correct person using two identifiers. Discussed confidentiality: Yes   I discussed the limitations of telemedicine and the availability of in person appointments.  Discussed there is a possibility of technology failure and discussed alternative modes of communication if that failure occurs.  I discussed that engaging in this telemedicine visit, they consent to the provision of behavioral healthcare and the services will be billed under their insurance.  Patient and/or legal guardian expressed understanding and consented to Telemedicine visit: Yes   Presenting Concerns: Patient and/or family reports the following symptoms/concerns: Mood improving the last two weeks with good support at home and finding things to look forward to/getting out of the house.  Duration of problem:  Postpartum  ; Severity of problem: mild  Patient and/or Family's Strengths/Protective Factors: Social connections, Social and Emotional competence, Concrete supports in place (healthy food, safe environments, etc.), Sense of purpose, and Physical Health (exercise, healthy diet, medication compliance, etc.)  Goals Addressed: Patient will:  Maintain reduced symptoms of: anxiety and insomnia     Demonstrate ability to: Increase healthy adjustment to current life circumstances, Increase adequate support systems for patient/family, and Increase motivation to adhere to plan of care  Progress towards Goals: Ongoing  Interventions: Interventions utilized:  Motivational Interviewing and Supportive Reflection Standardized Assessments completed: Not Needed  Patient and/or Family Response: Patient agrees with treatment plan.   Clinical Assessment/Diagnosis  Adjustment disorder with anxious mood  Grief    Patient may benefit from psychoeducation and brief therapeutic interventions regarding coping with symptoms of anxiety.  Plan: Follow up with behavioral health clinician on Call Jymir Dunaj at [phone], as needed. Behavioral recommendations:  -Continue prioritizing healthy self-care (regular meals, adequate rest; allowing practical help from supportive friends and family) -Consider new mom support group as needed at either [email or web address] or [email or web address] when partner resumes work -Continue plan to attend family gathering this weekend; fall/winter plans with family/friends  Referral(s): Integrated Art gallery manager (In Clinic) and [employer]:  new mom support  I discussed the assessment and treatment plan with the patient and/or parent/guardian. They were provided an opportunity to ask questions and all were answered. They agreed with the plan and demonstrated an understanding of the instructions.   They were advised to call back or seek an in-person evaluation if the symptoms worsen or if the condition fails to improve as anticipated.  Warren JAYSON Mering, LCSW     03/21/2024   10:19 AM 03/06/2024   11:13 AM 01/21/2024    1:19 PM 08/10/2023   12:12 PM 11/22/2020    2:13 PM  Depression screen PHQ 2/9  Decreased Interest 0 1 0 0 0  Down, Depressed, Hopeless 0 1 1 0 0  PHQ - 2 Score 0 2 1 0 0  Altered sleeping 0 3 2 2  0  Tired, decreased energy 0  3 0 2  0  Change in appetite 0 0 0 0 0  Feeling bad or failure about yourself  0 0 0 0 0  Trouble concentrating 0 0 0 0 0  Moving slowly or fidgety/restless 0 0 0 0 0  Suicidal thoughts 0 0 0 0 0  PHQ-9 Score 0 8 3 4  0  Difficult doing work/chores Not difficult at all    Not difficult at all      03/21/2024   10:20 AM 03/06/2024   11:14 AM 01/21/2024    1:19 PM 08/10/2023   12:12 PM  GAD 7 : Generalized Anxiety Score  Nervous, Anxious, on Edge 0 3 2 1   Control/stop worrying 0 1 1 0  Worry too much - different things 0 1 1 0  Trouble relaxing 0 0 0 0  Restless 0 0 0 0  Easily annoyed or irritable 1 3 1 1   Afraid - awful might happen 0 0 0 0  Total GAD 7 Score 1 8 5 2   Anxiety Difficulty Not difficult at all

## 2024-03-24 NOTE — Patient Instructions (Signed)
 [facility] for [facility] at [facility] [address] [place], [place] [address] [phone] (main office) [phone] [facility] office)  New Parent Support Groups [email or web address]

## 2024-04-02 ENCOUNTER — Encounter: Payer: Self-pay | Admitting: Family Medicine

## 2024-04-02 NOTE — Telephone Encounter (Signed)
 Patient requesting note to return to work. Maternity leave started 01/25/24 and delivered on 01/28/24, she is 9/5 postpartum.
